# Patient Record
Sex: Male | Born: 1960 | Race: White | Hispanic: No | Marital: Married | Smoking: Former smoker
Health system: Southern US, Community
[De-identification: ages and names within clinical notes are randomized; demographics above are authoritative.]

## PROBLEM LIST (undated history)

## (undated) DIAGNOSIS — R519 Headache, unspecified: Secondary | ICD-10-CM

## (undated) DIAGNOSIS — U071 COVID-19: Secondary | ICD-10-CM

## (undated) DIAGNOSIS — I2699 Other pulmonary embolism without acute cor pulmonale: Secondary | ICD-10-CM

## (undated) DIAGNOSIS — R5382 Chronic fatigue, unspecified: Secondary | ICD-10-CM

## (undated) DIAGNOSIS — K219 Gastro-esophageal reflux disease without esophagitis: Secondary | ICD-10-CM

## (undated) DIAGNOSIS — R413 Other amnesia: Secondary | ICD-10-CM

## (undated) HISTORY — DX: Headache, unspecified: R51.9

## (undated) HISTORY — DX: Other amnesia: R41.3

## (undated) HISTORY — DX: Other pulmonary embolism without acute cor pulmonale: I26.99

## (undated) HISTORY — DX: Chronic fatigue, unspecified: R53.82

## (undated) HISTORY — PX: HERNIA REPAIR: SHX51

---

## 1998-04-05 ENCOUNTER — Emergency Department (HOSPITAL_COMMUNITY): Admission: EM | Admit: 1998-04-05 | Discharge: 1998-04-06 | Payer: Self-pay | Admitting: Emergency Medicine

## 2001-06-08 ENCOUNTER — Encounter: Payer: Self-pay | Admitting: Internal Medicine

## 2001-06-08 ENCOUNTER — Encounter: Admission: RE | Admit: 2001-06-08 | Discharge: 2001-06-08 | Payer: Self-pay | Admitting: Internal Medicine

## 2001-06-14 ENCOUNTER — Encounter: Payer: Self-pay | Admitting: Internal Medicine

## 2001-06-14 ENCOUNTER — Encounter: Admission: RE | Admit: 2001-06-14 | Discharge: 2001-06-14 | Payer: Self-pay | Admitting: Internal Medicine

## 2005-01-26 ENCOUNTER — Ambulatory Visit: Payer: Self-pay | Admitting: Gastroenterology

## 2005-01-26 ENCOUNTER — Ambulatory Visit (HOSPITAL_COMMUNITY): Admission: RE | Admit: 2005-01-26 | Discharge: 2005-01-26 | Payer: Self-pay | Admitting: Gastroenterology

## 2005-02-02 ENCOUNTER — Ambulatory Visit: Payer: Self-pay | Admitting: Gastroenterology

## 2012-12-16 ENCOUNTER — Encounter: Payer: Self-pay | Admitting: Gastroenterology

## 2014-12-25 ENCOUNTER — Encounter: Payer: Self-pay | Admitting: Gastroenterology

## 2020-05-20 DIAGNOSIS — Z8701 Personal history of pneumonia (recurrent): Secondary | ICD-10-CM

## 2020-05-20 DIAGNOSIS — U099 Post covid-19 condition, unspecified: Secondary | ICD-10-CM

## 2020-05-20 DIAGNOSIS — R5381 Other malaise: Secondary | ICD-10-CM

## 2020-05-20 DIAGNOSIS — J9621 Acute and chronic respiratory failure with hypoxia: Secondary | ICD-10-CM | POA: Diagnosis not present

## 2020-05-21 DIAGNOSIS — J9621 Acute and chronic respiratory failure with hypoxia: Secondary | ICD-10-CM

## 2020-05-21 DIAGNOSIS — Z8701 Personal history of pneumonia (recurrent): Secondary | ICD-10-CM

## 2020-05-21 DIAGNOSIS — U099 Post covid-19 condition, unspecified: Secondary | ICD-10-CM | POA: Diagnosis not present

## 2020-05-21 DIAGNOSIS — R5381 Other malaise: Secondary | ICD-10-CM | POA: Diagnosis not present

## 2020-05-22 DIAGNOSIS — R5381 Other malaise: Secondary | ICD-10-CM

## 2020-05-22 DIAGNOSIS — J9621 Acute and chronic respiratory failure with hypoxia: Secondary | ICD-10-CM

## 2020-05-22 DIAGNOSIS — Z8701 Personal history of pneumonia (recurrent): Secondary | ICD-10-CM

## 2020-05-22 DIAGNOSIS — U099 Post covid-19 condition, unspecified: Secondary | ICD-10-CM

## 2020-05-23 DIAGNOSIS — R5381 Other malaise: Secondary | ICD-10-CM

## 2020-05-23 DIAGNOSIS — Z8701 Personal history of pneumonia (recurrent): Secondary | ICD-10-CM

## 2020-05-23 DIAGNOSIS — J9621 Acute and chronic respiratory failure with hypoxia: Secondary | ICD-10-CM

## 2020-05-23 DIAGNOSIS — U099 Post covid-19 condition, unspecified: Secondary | ICD-10-CM | POA: Diagnosis not present

## 2020-05-24 DIAGNOSIS — R5381 Other malaise: Secondary | ICD-10-CM

## 2020-05-24 DIAGNOSIS — J9621 Acute and chronic respiratory failure with hypoxia: Secondary | ICD-10-CM

## 2020-05-24 DIAGNOSIS — U099 Post covid-19 condition, unspecified: Secondary | ICD-10-CM

## 2020-05-24 DIAGNOSIS — Z8701 Personal history of pneumonia (recurrent): Secondary | ICD-10-CM

## 2020-05-25 DIAGNOSIS — U099 Post covid-19 condition, unspecified: Secondary | ICD-10-CM

## 2020-05-25 DIAGNOSIS — R5381 Other malaise: Secondary | ICD-10-CM | POA: Diagnosis not present

## 2020-05-25 DIAGNOSIS — Z8701 Personal history of pneumonia (recurrent): Secondary | ICD-10-CM

## 2020-05-25 DIAGNOSIS — J9621 Acute and chronic respiratory failure with hypoxia: Secondary | ICD-10-CM | POA: Diagnosis not present

## 2020-05-26 DIAGNOSIS — Z8701 Personal history of pneumonia (recurrent): Secondary | ICD-10-CM | POA: Diagnosis not present

## 2020-05-26 DIAGNOSIS — R5381 Other malaise: Secondary | ICD-10-CM

## 2020-05-26 DIAGNOSIS — U099 Post covid-19 condition, unspecified: Secondary | ICD-10-CM

## 2020-05-26 DIAGNOSIS — J9621 Acute and chronic respiratory failure with hypoxia: Secondary | ICD-10-CM

## 2020-06-11 ENCOUNTER — Ambulatory Visit: Payer: Managed Care, Other (non HMO) | Admitting: Internal Medicine

## 2020-06-11 ENCOUNTER — Encounter: Payer: Self-pay | Admitting: Internal Medicine

## 2020-06-11 VITALS — BP 107/77 | HR 101 | Temp 97.6°F | Resp 16 | Ht 76.0 in | Wt 229.6 lb

## 2020-06-11 DIAGNOSIS — R0602 Shortness of breath: Secondary | ICD-10-CM

## 2020-06-11 DIAGNOSIS — U099 Post covid-19 condition, unspecified: Secondary | ICD-10-CM

## 2020-06-11 DIAGNOSIS — J8410 Pulmonary fibrosis, unspecified: Secondary | ICD-10-CM

## 2020-06-11 NOTE — Patient Instructions (Signed)
Shortness of Breath, Adult Shortness of breath means you have trouble breathing. Shortness of breath could be a sign of a medical problem. Follow these instructions at home:  Watch for any changes in your symptoms.  Do not use any products that contain nicotine or tobacco, such as cigarettes, e-cigarettes, and chewing tobacco.  Do not smoke. Smoking can cause shortness of breath. If you need help to quit smoking, ask your doctor.  Avoid things that can make it harder to breathe, such as: ? Mold. ? Dust. ? Air pollution. ? Chemical smells. ? Things that can cause allergy symptoms (allergens), if you have allergies.  Keep your living space clean. Use products that help remove mold and dust.  Rest as needed. Slowly return to your normal activities.  Take over-the-counter and prescription medicines only as told by your doctor. This includes oxygen therapy and inhaled medicines.  Keep all follow-up visits as told by your doctor. This is important.   Contact a doctor if:  Your condition does not get better as soon as expected.  You have a hard time doing your normal activities, even after you rest.  You have new symptoms. Get help right away if:  Your shortness of breath gets worse.  You have trouble breathing when you are resting.  You feel light-headed or you pass out (faint).  You have a cough that is not helped by medicines.  You cough up blood.  You have pain with breathing.  You have pain in your chest, arms, shoulders, or belly (abdomen).  You have a fever.  You cannot walk up stairs.  You cannot exercise the way you normally do. These symptoms may represent a serious problem that is an emergency. Do not wait to see if the symptoms will go away. Get medical help right away. Call your local emergency services (911 in the [place] Do not drive yourself to the hospital. Summary  Shortness of breath is when you have trouble breathing enough air. It can be a sign of a  medical problem.  Avoid things that make it hard for you to breathe, such as smoking, pollution, mold, and dust.  Watch for any changes in your symptoms. Contact your doctor if you do not get better or you get worse. This information is not intended to replace advice given to you by your health care provider. Make sure you discuss any questions you have with your health care provider. Document Revised: 09/20/2017 Document Reviewed: 09/20/2017 Elsevier Patient Education  2021 [employer]  

## 2020-06-11 NOTE — Progress Notes (Signed)
[facility] [address] [place], [place] [address]  Pulmonary Sleep Medicine   Office Visit Note  Patient Name: Jack Barton DOB: 1960/11/26 MRN [id number]  Date of Service: 06/11/2020  Complaints/HPI: Post Covid. Still with some SOB. Also has sinus issues. He feels better but not back to baseline. Patient states that he uses incentive spirometery. He is a former smoker stopped age 60. Has history of pneumonia in the past.  The patient had a prolonged stay at [facility] he states that he is doing somewhat better now still with shortness of breath on exertion.  Still has occasional cough.  He wanted to be evaluated for Post COVID syndrome  ROS  General: (-) fever, (-) chills, (-) night sweats, (-) weakness Skin: (-) rashes, (-) itching,. Eyes: (-) visual changes, (-) redness, (-) itching. Nose and Sinuses: (-) nasal stuffiness or itchiness, (-) postnasal drip, (-) nosebleeds, (-) sinus trouble. Mouth and Throat: (-) sore throat, (-) hoarseness. Neck: (-) swollen glands, (-) enlarged thyroid, (-) neck pain. Respiratory: + cough, (-) bloody sputum, + coarse shortness of breath, - wheezing. Cardiovascular: - ankle swelling, (-) chest pain. Lymphatic: (-) lymph node enlargement. Neurologic: (-) numbness, (-) tingling. Psychiatric: (-) anxiety, (-) depression   Current Medication: Outpatient Encounter Medications as of 06/11/2020  Medication Sig  . cefdinir (OMNICEF) 300 MG capsule Take 300 mg by mouth 2 (two) times daily.  . pantoprazole (PROTONIX) 40 MG tablet Take 40 mg by mouth daily.   No facility-administered encounter medications on file as of 06/11/2020.    Surgical History: History reviewed. No pertinent surgical history.  Medical History: History reviewed. No pertinent past medical history.  Family History: History reviewed. No pertinent family history.  Social History: Social History   Socioeconomic History  . Marital status: Married     Spouse name: Not on file  . Number of children: Not on file  . Years of education: Not on file  . Highest education level: Not on file  Occupational History  . Not on file  Tobacco Use  . Smoking status: Never Smoker  . Smokeless tobacco: Never Used  Substance and Sexual Activity  . Alcohol use: Never  . Drug use: Never  . Sexual activity: Not on file  Other Topics Concern  . Not on file  Social History Narrative  . Not on file   Social Determinants of Health   Financial Resource Strain: Not on file  Food Insecurity: Not on file  Transportation Needs: Not on file  Physical Activity: Not on file  Stress: Not on file  Social Connections: Not on file  Intimate Partner Violence: Not on file    Vital Signs: Blood pressure 107/77, pulse (!) 101, temperature 97.6 F (36.4 C), resp. rate 16, height 6\' 4"  (1.93 m), weight 229 lb 9.6 oz (104.1 kg), SpO2 97 %.  Examination: General Appearance: The patient is well-developed, well-nourished, and in no distress. Skin: Gross inspection of skin unremarkable. Head: normocephalic, no gross deformities. Eyes: no gross deformities noted. ENT: ears appear grossly normal no exudates. Neck: Supple. No thyromegaly. No LAD. Respiratory: -. Cardiovascular: Normal S1 and S2 without murmur or rub. Extremities: No cyanosis. pulses are equal. Neurologic: Alert and oriented. No involuntary movements.  LABS: No results found for this or any previous visit (from the past 2160 hour(s)).  Radiology: No results found.  No results found.  No results found.    Assessment and Plan: Patient Active Problem List   Diagnosis Date Noted  .  Post covid-19 condition, unspecified 06/19/2020  . SOB (shortness of breath) 06/19/2020  . Pulmonary fibrosis (HCC) 06/19/2020  . Long COVID   . Multifocal pneumonia 06/14/2020  . Sepsis (HCC) 06/14/2020  . Acute hypoxemic respiratory failure (HCC) 06/14/2020  . Acute pulmonary embolism-submassive 06/14/2020   . DVT (deep venous thrombosis) (HCC) 06/14/2020  . GERD (gastroesophageal reflux disease)    1. Post covid-19 condition, unspecified He is slowly recovering however my concern is that he may have post COVID disease resulting in fibrotic lung disease.  We will get a plain chest x-ray to assess.  After the x-ray is done I think he will need a high resolution CT scan for a look at the parenchyma to see if there is any cystic lung disease noted.  This may help [place] prognostically to better understand his disease process  2. SOB (shortness of breath) Very slow to improve continues to be oxygen dependent.  Pulmonary functions - Pulmonary function test; Future - ECHOCARDIOGRAM COMPLETE; Future - 6 minute walk; Future  3. Pulmonary fibrosis (HCC) As above will get CT scan of the chest because of abnormal x-rays that were noted on the admission to [facility] in [place]. - Pulmonary function test; Future - CT Chest High Resolution; Future - 6 minute walk; Future   General Counseling: I have discussed the findings of the evaluation and examination with Jack Barton.  I have also discussed any further diagnostic evaluation thatmay be needed or ordered today. Jack Barton verbalizes understanding of the findings of todays visit. We also reviewed his medications today and discussed drug interactions and side effects including but not limited excessive drowsiness and altered mental states. We also discussed that there is always a risk not just to him but also people around him. he has been encouraged to call the office with any questions or concerns that should arise related to todays visit.  No orders of the defined types were placed in this encounter.    Time spent: 49  I have personally obtained a history, examined the patient, evaluated laboratory and imaging results, formulated the assessment and plan and placed orders.    Yevonne Pax, MD [facility] Pulmonary and Critical Care Sleep medicine

## 2020-06-14 ENCOUNTER — Emergency Department: Payer: Managed Care, Other (non HMO)

## 2020-06-14 ENCOUNTER — Inpatient Hospital Stay: Payer: Managed Care, Other (non HMO)

## 2020-06-14 ENCOUNTER — Inpatient Hospital Stay
Admit: 2020-06-14 | Discharge: 2020-06-14 | Disposition: A | Discharge status: Home / Self Care | Payer: Managed Care, Other (non HMO) | Attending: Internal Medicine | Admitting: Internal Medicine

## 2020-06-14 ENCOUNTER — Encounter: Payer: Self-pay | Admitting: Emergency Medicine

## 2020-06-14 ENCOUNTER — Inpatient Hospital Stay
Admission: EM | Admit: 2020-06-14 | Discharge: 2020-06-16 | DRG: 853 | Disposition: A | Discharge status: Home / Self Care | Payer: Managed Care, Other (non HMO) | Attending: Internal Medicine | Admitting: Internal Medicine

## 2020-06-14 ENCOUNTER — Other Ambulatory Visit (INDEPENDENT_AMBULATORY_CARE_PROVIDER_SITE_OTHER): Payer: Self-pay | Admitting: Vascular Surgery

## 2020-06-14 ENCOUNTER — Encounter
Admission: EM | Disposition: A | Discharge status: Home / Self Care | Payer: Self-pay | Source: Home / Self Care | Attending: Internal Medicine

## 2020-06-14 ENCOUNTER — Other Ambulatory Visit: Payer: Self-pay

## 2020-06-14 DIAGNOSIS — I5189 Other ill-defined heart diseases: Secondary | ICD-10-CM | POA: Diagnosis present

## 2020-06-14 DIAGNOSIS — I2609 Other pulmonary embolism with acute cor pulmonale: Secondary | ICD-10-CM | POA: Diagnosis not present

## 2020-06-14 DIAGNOSIS — J188 Other pneumonia, unspecified organism: Secondary | ICD-10-CM | POA: Diagnosis present

## 2020-06-14 DIAGNOSIS — K219 Gastro-esophageal reflux disease without esophagitis: Secondary | ICD-10-CM | POA: Diagnosis present

## 2020-06-14 DIAGNOSIS — Z20822 Contact with and (suspected) exposure to covid-19: Secondary | ICD-10-CM | POA: Diagnosis present

## 2020-06-14 DIAGNOSIS — Z8041 Family history of malignant neoplasm of ovary: Secondary | ICD-10-CM

## 2020-06-14 DIAGNOSIS — I2584 Coronary atherosclerosis due to calcified coronary lesion: Secondary | ICD-10-CM | POA: Diagnosis present

## 2020-06-14 DIAGNOSIS — U099 Post covid-19 condition, unspecified: Secondary | ICD-10-CM

## 2020-06-14 DIAGNOSIS — A419 Sepsis, unspecified organism: Secondary | ICD-10-CM | POA: Diagnosis present

## 2020-06-14 DIAGNOSIS — Y95 Nosocomial condition: Secondary | ICD-10-CM | POA: Diagnosis present

## 2020-06-14 DIAGNOSIS — K4090 Unilateral inguinal hernia, without obstruction or gangrene, not specified as recurrent: Secondary | ICD-10-CM | POA: Diagnosis present

## 2020-06-14 DIAGNOSIS — I82403 Acute embolism and thrombosis of unspecified deep veins of lower extremity, bilateral: Secondary | ICD-10-CM | POA: Diagnosis present

## 2020-06-14 DIAGNOSIS — R0602 Shortness of breath: Secondary | ICD-10-CM

## 2020-06-14 DIAGNOSIS — K760 Fatty (change of) liver, not elsewhere classified: Secondary | ICD-10-CM | POA: Diagnosis present

## 2020-06-14 DIAGNOSIS — J9601 Acute respiratory failure with hypoxia: Secondary | ICD-10-CM | POA: Diagnosis present

## 2020-06-14 DIAGNOSIS — J9691 Respiratory failure, unspecified with hypoxia: Secondary | ICD-10-CM

## 2020-06-14 DIAGNOSIS — I82409 Acute embolism and thrombosis of unspecified deep veins of unspecified lower extremity: Secondary | ICD-10-CM | POA: Diagnosis present

## 2020-06-14 DIAGNOSIS — I2699 Other pulmonary embolism without acute cor pulmonale: Secondary | ICD-10-CM | POA: Diagnosis present

## 2020-06-14 DIAGNOSIS — I82453 Acute embolism and thrombosis of peroneal vein, bilateral: Secondary | ICD-10-CM | POA: Diagnosis not present

## 2020-06-14 DIAGNOSIS — J189 Pneumonia, unspecified organism: Secondary | ICD-10-CM | POA: Diagnosis present

## 2020-06-14 DIAGNOSIS — R109 Unspecified abdominal pain: Secondary | ICD-10-CM | POA: Diagnosis present

## 2020-06-14 HISTORY — DX: COVID-19: U07.1

## 2020-06-14 HISTORY — PX: PULMONARY THROMBECTOMY: CATH118295

## 2020-06-14 HISTORY — DX: Gastro-esophageal reflux disease without esophagitis: K21.9

## 2020-06-14 LAB — CBC WITH DIFFERENTIAL/PLATELET
Abs Immature Granulocytes: 0.06 10*3/uL (ref 0.00–0.07)
Basophils Absolute: 0.1 10*3/uL (ref 0.0–0.1)
Basophils Relative: 1 %
Eosinophils Absolute: 0.1 10*3/uL (ref 0.0–0.5)
Eosinophils Relative: 1 %
HCT: 39.9 % (ref 39.0–52.0)
Hemoglobin: 13.8 g/dL (ref 13.0–17.0)
Immature Granulocytes: 1 %
Lymphocytes Relative: 14 %
Lymphs Abs: 1.6 10*3/uL (ref 0.7–4.0)
MCH: 30.7 pg (ref 26.0–34.0)
MCHC: 34.6 g/dL (ref 30.0–36.0)
MCV: 88.9 fL (ref 80.0–100.0)
Monocytes Absolute: 0.9 10*3/uL (ref 0.1–1.0)
Monocytes Relative: 8 %
Neutro Abs: 8.5 10*3/uL — ABNORMAL HIGH (ref 1.7–7.7)
Neutrophils Relative %: 75 %
Platelets: 255 10*3/uL (ref 150–400)
RBC: 4.49 MIL/uL (ref 4.22–5.81)
RDW: 12.9 % (ref 11.5–15.5)
WBC: 11.3 10*3/uL — ABNORMAL HIGH (ref 4.0–10.5)
nRBC: 0 % (ref 0.0–0.2)

## 2020-06-14 LAB — URINALYSIS, COMPLETE (UACMP) WITH MICROSCOPIC
Bacteria, UA: NONE SEEN
Bilirubin Urine: NEGATIVE
Glucose, UA: NEGATIVE mg/dL
Hgb urine dipstick: NEGATIVE
Ketones, ur: NEGATIVE mg/dL
Leukocytes,Ua: NEGATIVE
Nitrite: NEGATIVE
Protein, ur: NEGATIVE mg/dL
Specific Gravity, Urine: 1.02 (ref 1.005–1.030)
pH: 5 (ref 5.0–8.0)

## 2020-06-14 LAB — RESP PANEL BY RT-PCR (FLU A&B, COVID) ARPGX2
Influenza A by PCR: NEGATIVE
Influenza B by PCR: NEGATIVE
SARS Coronavirus 2 by RT PCR: NEGATIVE

## 2020-06-14 LAB — COMPREHENSIVE METABOLIC PANEL
ALT: 17 U/L (ref 0–44)
AST: 20 U/L (ref 15–41)
Albumin: 3.4 g/dL — ABNORMAL LOW (ref 3.5–5.0)
Alkaline Phosphatase: 91 U/L (ref 38–126)
Anion gap: 9 (ref 5–15)
BUN: 13 mg/dL (ref 6–20)
CO2: 23 mmol/L (ref 22–32)
Calcium: 8.7 mg/dL — ABNORMAL LOW (ref 8.9–10.3)
Chloride: 101 mmol/L (ref 98–111)
Creatinine, Ser: 0.72 mg/dL (ref 0.61–1.24)
GFR, Estimated: >60 mL/min (ref >60)
Glucose, Bld: 120 mg/dL — ABNORMAL HIGH (ref 70–99)
Potassium: 4 mmol/L (ref 3.5–5.1)
Sodium: 133 mmol/L — ABNORMAL LOW (ref 135–145)
Total Bilirubin: 1.2 mg/dL (ref 0.3–1.2)
Total Protein: 7.5 g/dL (ref 6.5–8.1)

## 2020-06-14 LAB — FIBRIN DERIVATIVES D-DIMER (ARMC ONLY): Fibrin derivatives D-dimer (ARMC): 5565.99 ng/mL (FEU) — ABNORMAL HIGH (ref 0.00–499.00)

## 2020-06-14 LAB — PROCALCITONIN: Procalcitonin: <0.1 ng/mL

## 2020-06-14 LAB — LIPASE, BLOOD: Lipase: 24 U/L (ref 11–51)

## 2020-06-14 LAB — STREP PNEUMONIAE URINARY ANTIGEN: Strep Pneumo Urinary Antigen: NEGATIVE

## 2020-06-14 LAB — PROTIME-INR
INR: 1.1 (ref 0.8–1.2)
Prothrombin Time: 13.7 seconds (ref 11.4–15.2)

## 2020-06-14 LAB — SEDIMENTATION RATE: Sed Rate: 72 mm/hr — ABNORMAL HIGH (ref 0–20)

## 2020-06-14 LAB — HIV ANTIBODY (ROUTINE TESTING W REFLEX): HIV Screen 4th Generation wRfx: NONREACTIVE

## 2020-06-14 LAB — TROPONIN I (HIGH SENSITIVITY)
Troponin I (High Sensitivity): 4 ng/L (ref <18)
Troponin I (High Sensitivity): 15 ng/L (ref <18)

## 2020-06-14 LAB — BRAIN NATRIURETIC PEPTIDE: B Natriuretic Peptide: 16.1 pg/mL (ref 0.0–100.0)

## 2020-06-14 LAB — LACTIC ACID, PLASMA: Lactic Acid, Venous: 1.5 mmol/L (ref 0.5–1.9)

## 2020-06-14 LAB — APTT: aPTT: 36 seconds (ref 24–36)

## 2020-06-14 SURGERY — PULMONARY THROMBECTOMY
Anesthesia: Moderate Sedation | Laterality: Bilateral

## 2020-06-14 MED ORDER — VANCOMYCIN HCL IN DEXTROSE 1-5 GM/200ML-% IV SOLN
1000.0000 mg | Freq: Once | INTRAVENOUS | Status: AC
  Administered 2020-06-14: 1000 mg via INTRAVENOUS
  Filled 2020-06-14: qty 200

## 2020-06-14 MED ORDER — ALTEPLASE 2 MG IJ SOLR
INTRAMUSCULAR | Status: AC
  Filled 2020-06-14: qty 10

## 2020-06-14 MED ORDER — SODIUM CHLORIDE 0.9% FLUSH
3.0000 mL | INTRAVENOUS | Status: DC | PRN
  Administered 2020-06-14: 3 mL via INTRAVENOUS

## 2020-06-14 MED ORDER — ONDANSETRON HCL 4 MG/2ML IJ SOLN: 4.0000 mg | Freq: Three times a day (TID) | INTRAMUSCULAR | Status: DC | PRN

## 2020-06-14 MED ORDER — IODIXANOL 320 MG/ML IV SOLN
INTRAVENOUS | Status: DC | PRN
  Administered 2020-06-14: 30 mL

## 2020-06-14 MED ORDER — FENTANYL CITRATE (PF) 100 MCG/2ML IJ SOLN
INTRAMUSCULAR | Status: AC
  Administered 2020-06-14: 50 ug
  Filled 2020-06-14: qty 2

## 2020-06-14 MED ORDER — IPRATROPIUM-ALBUTEROL 0.5-2.5 (3) MG/3ML IN SOLN
3.0000 mL | RESPIRATORY_TRACT | Status: DC
  Administered 2020-06-14 – 2020-06-16 (×11): 3 mL via RESPIRATORY_TRACT
  Filled 2020-06-14 (×12): qty 3

## 2020-06-14 MED ORDER — METHYLPREDNISOLONE SODIUM SUCC 125 MG IJ SOLR: 125.0000 mg | Freq: Once | INTRAMUSCULAR | Status: DC | PRN

## 2020-06-14 MED ORDER — LACTATED RINGERS IV BOLUS (SEPSIS)
500.0000 mL | Freq: Once | INTRAVENOUS | Status: AC
  Administered 2020-06-14: 500 mL via INTRAVENOUS

## 2020-06-14 MED ORDER — VANCOMYCIN HCL 1250 MG/250ML IV SOLN
1250.0000 mg | Freq: Three times a day (TID) | INTRAVENOUS | Status: DC
  Administered 2020-06-14 – 2020-06-15 (×2): 1250 mg via INTRAVENOUS
  Filled 2020-06-14 (×5): qty 250

## 2020-06-14 MED ORDER — DM-GUAIFENESIN ER 30-600 MG PO TB12: 1.0000 | ORAL_TABLET | Freq: Two times a day (BID) | ORAL | Status: DC | PRN

## 2020-06-14 MED ORDER — HYDROMORPHONE HCL 1 MG/ML IJ SOLN
1.0000 mg | INTRAMUSCULAR | Status: AC
Start: 2020-06-14 — End: 2020-06-14
  Administered 2020-06-14: 1 mg via INTRAVENOUS
  Filled 2020-06-14: qty 1

## 2020-06-14 MED ORDER — MORPHINE SULFATE (PF) 2 MG/ML IV SOLN
2.0000 mg | INTRAVENOUS | Status: DC | PRN
  Administered 2020-06-14 – 2020-06-16 (×7): 2 mg via INTRAVENOUS
  Filled 2020-06-14 (×7): qty 1

## 2020-06-14 MED ORDER — ALPRAZOLAM 0.5 MG PO TABS
0.5000 mg | ORAL_TABLET | Freq: Two times a day (BID) | ORAL | Status: DC | PRN
  Administered 2020-06-14 – 2020-06-16 (×4): 0.5 mg via ORAL
  Filled 2020-06-14 (×4): qty 1

## 2020-06-14 MED ORDER — ONDANSETRON HCL 4 MG/2ML IJ SOLN: 4.0000 mg | Freq: Four times a day (QID) | INTRAMUSCULAR | Status: DC | PRN

## 2020-06-14 MED ORDER — HEPARIN (PORCINE) 25000 UT/250ML-% IV SOLN
1800.0000 [IU]/h | INTRAVENOUS | Status: DC
  Administered 2020-06-14 (×2): 1800 [IU]/h via INTRAVENOUS
  Filled 2020-06-14: qty 250

## 2020-06-14 MED ORDER — HEPARIN SODIUM (PORCINE) 1000 UNIT/ML IJ SOLN
INTRAMUSCULAR | Status: AC
  Filled 2020-06-14: qty 1

## 2020-06-14 MED ORDER — SODIUM CHLORIDE 0.9 % IV SOLN: INTRAVENOUS | Status: DC

## 2020-06-14 MED ORDER — HEPARIN SODIUM (PORCINE) 1000 UNIT/ML IJ SOLN
INTRAMUSCULAR | Status: DC | PRN
  Administered 2020-06-14: 2000 [IU] via INTRAVENOUS

## 2020-06-14 MED ORDER — ONDANSETRON HCL 4 MG/2ML IJ SOLN
4.0000 mg | INTRAMUSCULAR | Status: AC
  Administered 2020-06-14: 4 mg via INTRAVENOUS
  Filled 2020-06-14: qty 2

## 2020-06-14 MED ORDER — HYDROMORPHONE HCL 1 MG/ML IJ SOLN
0.5000 mg | INTRAMUSCULAR | Status: AC
  Administered 2020-06-14: 0.5 mg via INTRAVENOUS
  Filled 2020-06-14: qty 1

## 2020-06-14 MED ORDER — SODIUM CHLORIDE 0.9 % IV SOLN
2.0000 g | Freq: Once | INTRAVENOUS | Status: AC
  Administered 2020-06-14: 2 g via INTRAVENOUS
  Filled 2020-06-14: qty 2

## 2020-06-14 MED ORDER — SODIUM CHLORIDE 0.9 % IV BOLUS
500.0000 mL | Freq: Once | INTRAVENOUS | Status: AC
  Administered 2020-06-14: 500 mL via INTRAVENOUS

## 2020-06-14 MED ORDER — DIPHENHYDRAMINE HCL 50 MG/ML IJ SOLN: 50.0000 mg | Freq: Once | INTRAMUSCULAR | Status: DC | PRN

## 2020-06-14 MED ORDER — LACTATED RINGERS IV SOLN: INTRAVENOUS | Status: DC

## 2020-06-14 MED ORDER — MIDAZOLAM HCL 5 MG/5ML IJ SOLN
INTRAMUSCULAR | Status: AC
  Administered 2020-06-14: 2 mg
  Filled 2020-06-14: qty 5

## 2020-06-14 MED ORDER — MORPHINE SULFATE (PF) 2 MG/ML IV SOLN
2.0000 mg | Freq: Once | INTRAVENOUS | Status: DC
  Filled 2020-06-14: qty 1

## 2020-06-14 MED ORDER — KETOROLAC TROMETHAMINE 30 MG/ML IJ SOLN: 30.0000 mg | Freq: Four times a day (QID) | INTRAMUSCULAR | Status: DC | PRN

## 2020-06-14 MED ORDER — FUROSEMIDE 10 MG/ML IJ SOLN
40.0000 mg | Freq: Once | INTRAMUSCULAR | Status: AC
  Administered 2020-06-14: 40 mg via INTRAVENOUS
  Filled 2020-06-14: qty 4

## 2020-06-14 MED ORDER — MIDAZOLAM HCL 2 MG/ML PO SYRP: 8.0000 mg | ORAL_SOLUTION | Freq: Once | ORAL | Status: DC | PRN

## 2020-06-14 MED ORDER — CLINDAMYCIN PHOSPHATE 300 MG/50ML IV SOLN
INTRAVENOUS | Status: AC
  Administered 2020-06-14: 300 mg
  Filled 2020-06-14: qty 50

## 2020-06-14 MED ORDER — PANTOPRAZOLE SODIUM 40 MG PO TBEC
40.0000 mg | DELAYED_RELEASE_TABLET | Freq: Every day | ORAL | Status: DC
  Administered 2020-06-15 – 2020-06-16 (×2): 40 mg via ORAL
  Filled 2020-06-14 (×2): qty 1

## 2020-06-14 MED ORDER — OXYCODONE-ACETAMINOPHEN 5-325 MG PO TABS
1.0000 | ORAL_TABLET | ORAL | Status: DC | PRN
  Administered 2020-06-15: 1 via ORAL
  Filled 2020-06-14: qty 1

## 2020-06-14 MED ORDER — FAMOTIDINE 20 MG PO TABS: 40.0000 mg | ORAL_TABLET | Freq: Once | ORAL | Status: DC | PRN

## 2020-06-14 MED ORDER — CLINDAMYCIN PHOSPHATE 300 MG/50ML IV SOLN
300.0000 mg | Freq: Once | INTRAVENOUS | Status: DC
  Filled 2020-06-14: qty 50

## 2020-06-14 MED ORDER — IOHEXOL 350 MG/ML SOLN
100.0000 mL | Freq: Once | INTRAVENOUS | Status: AC | PRN
  Administered 2020-06-14: 100 mL via INTRAVENOUS

## 2020-06-14 MED ORDER — MORPHINE SULFATE (PF) 2 MG/ML IV SOLN
2.0000 mg | Freq: Once | INTRAVENOUS | Status: AC
Start: 2020-06-14 — End: 2020-06-14
  Administered 2020-06-14: 2 mg via INTRAVENOUS
  Filled 2020-06-14: qty 1

## 2020-06-14 MED ORDER — ALBUTEROL SULFATE (2.5 MG/3ML) 0.083% IN NEBU
INHALATION_SOLUTION | RESPIRATORY_TRACT | Status: AC
  Administered 2020-06-14: 2.5 mg
  Filled 2020-06-14: qty 3

## 2020-06-14 MED ORDER — ALBUTEROL SULFATE (2.5 MG/3ML) 0.083% IN NEBU: 2.5000 mg | INHALATION_SOLUTION | RESPIRATORY_TRACT | Status: DC | PRN

## 2020-06-14 MED ORDER — SODIUM CHLORIDE 0.9 % IV SOLN
2.0000 g | Freq: Three times a day (TID) | INTRAVENOUS | Status: DC
  Administered 2020-06-14 – 2020-06-16 (×6): 2 g via INTRAVENOUS
  Filled 2020-06-14 (×9): qty 2

## 2020-06-14 MED ORDER — HEPARIN BOLUS VIA INFUSION
7000.0000 [IU] | Freq: Once | INTRAVENOUS | Status: AC
  Administered 2020-06-14: 7000 [IU] via INTRAVENOUS
  Filled 2020-06-14: qty 7000

## 2020-06-14 MED ORDER — ACETAMINOPHEN 325 MG PO TABS
650.0000 mg | ORAL_TABLET | Freq: Four times a day (QID) | ORAL | Status: DC | PRN
  Administered 2020-06-16: 650 mg via ORAL
  Filled 2020-06-14: qty 2

## 2020-06-14 MED ORDER — HEPARIN (PORCINE) 25000 UT/250ML-% IV SOLN
1800.0000 [IU]/h | INTRAVENOUS | Status: DC
  Administered 2020-06-14: 1800 [IU]/h via INTRAVENOUS
  Filled 2020-06-14: qty 250

## 2020-06-14 MED ORDER — HYDROMORPHONE HCL 1 MG/ML IJ SOLN: 1.0000 mg | Freq: Once | INTRAMUSCULAR | Status: DC | PRN

## 2020-06-14 SURGICAL SUPPLY — 14 items
CANISTER PENUMBRA ENGINE (MISCELLANEOUS) ×2 IMPLANT
CATH ANGIO 5F PIGTAIL 100CM (CATHETERS) ×2 IMPLANT
CATH INFINITI JR4 5F (CATHETERS) ×2 IMPLANT
CATH LIGHTNING 8 XTORQ 115 (CATHETERS) ×2 IMPLANT
DEVICE SAFEGUARD 24CM (GAUZE/BANDAGES/DRESSINGS) ×2 IMPLANT
GLIDEWIRE ADV .014X300CM (WIRE) ×2 IMPLANT
GLIDEWIRE ADV .035X260CM (WIRE) ×2 IMPLANT
NEEDLE ENTRY 21GA 7CM ECHOTIP (NEEDLE) ×2 IMPLANT
PACK ANGIOGRAPHY (CUSTOM PROCEDURE TRAY) ×2 IMPLANT
SET INTRO CAPELLA COAXIAL (SET/KITS/TRAYS/PACK) ×2 IMPLANT
SHEATH BRITE TIP 8FRX11 (SHEATH) ×2 IMPLANT
TUBING CONTRAST HIGH PRESS 72 (TUBING) ×2 IMPLANT
VALVE HEMO TOUHY BORST Y (ADAPTER) ×2 IMPLANT
WIRE GUIDERIGHT .035X150 (WIRE) ×2 IMPLANT

## 2020-06-14 NOTE — ED Notes (Signed)
ED at bedside.  

## 2020-06-14 NOTE — Consult Note (Signed)
[facility] Vascular Consult Note  MRN : [id number]  Jack Barton is a 60 y.o. ([birth year]) male who presents with chief complaint of  Chief Complaint  Patient presents with  . Shortness of Breath   History of Present Illness:  The patient is a 60 year old male a with medical history significant of GERD, recent COVID-19 infection, who presents with shortness breath, left flank pain.  Patient was recently hospitalized due to COVID-19 infection and discharged on 1/27 from [facility].  Patient states that he developed left flank pain in left back and left rib cage since last night.  The pain is pleuritic, sharp, severe, nonradiating, aggravated by deep breath.  He has a dry cough, no fever or chills.  His shortness breath has worsened. Pt states that this morning his O2 sat was low and his heart rate was fast.  No nausea vomiting, diarrhea or abdominal pain.  No symptoms of UTI. Upon arrival to ED, patient was noted to have acute respiratory distress with hyperventilation. Patient was found to have oxygen desaturated to 85% on room air, which improved to 95% on 4 L oxygen.  CTA chest (06/14/20): 1. Pulmonary emboli arise from each distal main pulmonary artery with extension into multiple lower lobe pulmonary artery branches bilaterally. Pulmonary embolus also seen in the proximal aspect of the right upper lobe pulmonary artery. Positive for acute PE with CTevidence of right heart strain (RV/LV Ratio = 1.2) consistent with at least submassive (intermediate risk) PE. The presence of right heart strain has been associated with an increased risk of morbidity and mortality.  Vascular surgery was consulted by Dr. Clyde Lundborg for possible thrombectomy / thrombolysis.  Current Facility-Administered Medications  Medication Dose Route Frequency Provider Last Rate Last Admin  . 0.9 %  sodium chloride infusion   Intravenous Continuous Azaryah Oleksy A, PA-C      . [MAR Hold]  acetaminophen (TYLENOL) tablet 650 mg  650 mg Oral Q6H PRN Lorretta Harp, MD      . Mitzi Hansen Hold] albuterol (PROVENTIL) (2.5 MG/3ML) 0.083% nebulizer solution 2.5 mg  2.5 mg Nebulization Q4H PRN Lorretta Harp, MD      . Mitzi Hansen Hold] ceFEPIme (MAXIPIME) 2 g in sodium chloride 0.9 % 100 mL IVPB  2 g Intravenous Q8H Hallaji, Sheema M, RPH      . [START ON 06/15/2020] clindamycin (CLEOCIN) IVPB 300 mg  300 mg Intravenous Once Theordore Cisnero A, PA-C      . [MAR Hold] dextromethorphan-guaiFENesin (MUCINEX DM) 30-600 MG per 12 hr tablet 1 tablet  1 tablet Oral BID PRN Lorretta Harp, MD      . diphenhydrAMINE (BENADRYL) injection 50 mg  50 mg Intravenous Once PRN Bellatrix Devonshire A, PA-C      . famotidine (PEPCID) tablet 40 mg  40 mg Oral Once PRN Valmai Vandenberghe A, PA-C      . heparin ADULT infusion 100 units/mL (25000 units/249mL)  1,800 Units/hr Intravenous Continuous Lorretta Harp, MD   Stopped at 06/14/20 1545  . heparin sodium (porcine) 1000 UNIT/ML injection           . heparin sodium (porcine) injection    PRN Schnier, Latina Craver, MD   2,000 Units at 06/14/20 1625  . [MAR Hold] HYDROmorphone (DILAUDID) injection 1 mg  1 mg Intravenous Once PRN Nubia Ziesmer, Ranae Plumber, PA-C      . [MAR Hold] ipratropium-albuterol (DUONEB) 0.5-2.5 (3) MG/3ML nebulizer solution 3 mL  3 mL Nebulization Q4H Lorretta Harp, MD  3 mL at 06/14/20 1255  . lactated ringers infusion   Intravenous Continuous Lorretta Harp, MD 100 mL/hr at 06/14/20 1245 Rate Change at 06/14/20 1245  . methylPREDNISolone sodium succinate (SOLU-MEDROL) 125 mg/2 mL injection 125 mg  125 mg Intravenous Once PRN Liam Bossman A, PA-C      . midazolam (VERSED) 2 MG/ML syrup 8 mg  8 mg Oral Once PRN Leocadia Idleman, Ranae Plumber, PA-C      . [MAR Hold] morphine 2 MG/ML injection 2 mg  2 mg Intravenous Q4H PRN Lorretta Harp, MD   2 mg at 06/14/20 1248  . [MAR Hold] ondansetron (ZOFRAN) injection 4 mg  4 mg Intravenous Q8H PRN Lorretta Harp, MD      . Mitzi Hansen Hold] ondansetron  [facility]) injection 4 mg  4 mg Intravenous Q6H PRN [place] Silveria, Ranae Plumber, PA-C      . [MAR Hold] oxyCODONE-acetaminophen (PERCOCET/ROXICET) 5-325 MG per tablet 1 tablet  1 tablet Oral Q4H PRN Lorretta Harp, MD      . Mitzi Hansen Hold] pantoprazole (PROTONIX) EC tablet 40 mg  40 mg Oral Daily Lorretta Harp, MD      . Mitzi Hansen Hold] vancomycin (VANCOREADY) IVPB 1250 mg/250 mL  1,250 mg Intravenous Q8H Hallaji, Mardene Speak, [facility]       Past Medical History:  Diagnosis Date  . COVID-19 virus infection   . GERD (gastroesophageal reflux disease)    Past Surgical History:  Procedure Laterality Date  . HERNIA REPAIR     Social History Social History   Tobacco Use  . Smoking status: Never Smoker  . Smokeless tobacco: Never Used  Substance Use Topics  . Alcohol use: Never  . Drug use: Never   Family History Family History  Problem Relation Age of Onset  . Ovarian cancer Mother   Denies family history of peripheral artery disease, venous disease or bleeding/clotting disorders.  No Known Allergies  REVIEW OF SYSTEMS (Negative unless checked)  Constitutional: [] Weight loss  [] Fever  [] Chills Cardiac: [] Chest pain   [] Chest pressure   [] Palpitations   [] Shortness of breath when laying flat   [] Shortness of breath at rest   [] Shortness of breath with exertion. Vascular:  [] Pain in legs with walking   [] Pain in legs at rest   [] Pain in legs when laying flat   [] Claudication   [] Pain in feet when walking  [] Pain in feet at rest  [] Pain in feet when laying flat   [] History of DVT   [] Phlebitis   [] Swelling in legs   [] Varicose veins   [] Non-healing ulcers Pulmonary:   [] Uses home oxygen   [] Productive cough   [] Hemoptysis   [] Wheeze  [] COPD   [] Asthma Neurologic:  [] Dizziness  [] Blackouts   [] Seizures   [] History of stroke   [] History of TIA  [] Aphasia   [] Temporary blindness   [] Dysphagia   [] Weakness or numbness in arms   [] Weakness or numbness in legs Musculoskeletal:  [] Arthritis   [] Joint swelling   [] Joint pain    [] Low back pain Hematologic:  [] Easy bruising  [] Easy bleeding   [] Hypercoagulable state   [] Anemic  [] Hepatitis Gastrointestinal:  [] Blood in stool   [] Vomiting blood  [] Gastroesophageal reflux/heartburn   [] Difficulty swallowing. Genitourinary:  [] Chronic kidney disease   [] Difficult urination  [] Frequent urination  [] Burning with urination   [] Blood in urine Skin:  [] Rashes   [] Ulcers   [] Wounds Psychological:  [] History of anxiety   []  History of major depression.  Physical Examination  Vitals:   06/14/20 1430  06/14/20 1500 06/14/20 1538 06/14/20 1613  BP: 120/85 (!) 141/85 135/83   Pulse: (!) 116 (!) 117 (!) 125   Resp: (!) 33 (!) 30 (!) 38   Temp:      TempSrc:      SpO2: 93% 93% 93% 95%  Weight:   104.3 kg   Height:   6\' 4"  (1.93 m)    Body mass index is 27.99 kg/m. Gen:  WD/WN, labored breathing Head: [place]/AT, No temporalis wasting. Prominent temp pulse not noted. Ear/Nose/Throat: Hearing grossly intact, nares w/o erythema or drainage, oropharynx w/o Erythema/Exudate Eyes: Sclera non-icteric, conjunctiva clear Neck: Trachea midline.  No JVD.  Pulmonary: Labored breathing Cardiac: RRR, normal S1, S2. Vascular:  Vessel Right Left  Radial Palpable Palpable  Ulnar Palpable Palpable  Brachial Palpable Palpable  Carotid Palpable, without bruit Palpable, without bruit  Aorta Not palpable N/A  Femoral Palpable Palpable  Popliteal Palpable Palpable  PT Palpable Palpable  DP Palpable Palpable   Gastrointestinal: soft, non-tender/non-distended. No guarding/reflex.  Musculoskeletal: M/S 5/5 throughout.  Extremities without ischemic changes.  No deformity or atrophy. No edema. Neurologic: Sensation grossly intact in extremities.  Symmetrical.  Speech is fluent. Motor exam as listed above. Psychiatric: Judgment intact, Mood & affect appropriate for pt's clinical situation. Dermatologic: No rashes or ulcers noted.  No cellulitis or open wounds. Lymph : No Cervical, Axillary, or  Inguinal lymphadenopathy.  CBC Lab Results  Component Value Date   WBC 11.3 (H) 06/14/2020   HGB 13.8 06/14/2020   HCT 39.9 06/14/2020   MCV 88.9 06/14/2020   PLT 255 06/14/2020   BMET    Component Value Date/Time   NA 133 (L) 06/14/2020 0913   K 4.0 06/14/2020 0913   CL 101 06/14/2020 0913   CO2 23 06/14/2020 0913   GLUCOSE 120 (H) 06/14/2020 0913   BUN 13 06/14/2020 0913   CREATININE 0.72 06/14/2020 0913   CALCIUM 8.7 (L) 06/14/2020 0913   GFRNONAA >60 06/14/2020 0913   Estimated Creatinine Clearance: 131.9 mL/min (by C-G formula based on SCr of 0.72 mg/dL).  COAG Lab Results  Component Value Date   INR 1.1 06/14/2020   Radiology CT Angio Chest PE W and/or Wo Contrast  Result Date: 06/14/2020 CLINICAL DATA:  Chest and abdominal pain EXAM: CT ANGIOGRAPHY CHEST CT ABDOMEN AND PELVIS WITH CONTRAST TECHNIQUE: Multidetector CT imaging of the chest was performed using the standard protocol during bolus administration of intravenous contrast. Multiplanar CT image reconstructions and MIPs were obtained to evaluate the vascular anatomy. Multidetector CT imaging of the abdomen and pelvis was performed using the standard protocol during bolus administration of intravenous contrast. CONTRAST:  08/12/2020 OMNIPAQUE IOHEXOL 350 MG/ML SOLN COMPARISON:  Chest radiograph June 14, 2020; CT angiogram chest May 09, 2020 CT abdomen and pelvis August 11 2017 FINDINGS: CTA CHEST FINDINGS Cardiovascular: There are pulmonary emboli arising from each distal main pulmonary artery with extension of pulmonary emboli into multiple lower lobe pulmonary artery branches bilaterally. Small focus of pulmonary embolus at the origin of the right upper lobe pulmonary artery noted. The right ventricle to left ventricle diameter ratio is 1.2, indicative of a degree of right heart strain. There is no appreciable thoracic aortic aneurysm or dissection. Visualized great vessels appear normal. There are occasional foci of  coronary artery calcification. There is no appreciable pericardial effusion or pericardial thickening. Mediastinum/Nodes: Visualized thyroid appears normal. There is no evident thoracic adenopathy by size criteria. There are scattered subcentimeter mediastinal lymph nodes which do not meet  size criteria for pathologic significance. No esophageal lesions are evident. Lungs/Pleura: There is airspace opacity throughout both lower lobes with more scattered opacity in the periphery of each upper lobe and right middle lobe. There is suggestion of a degree of underlying fibrotic type change. There are no appreciable pleural effusions. Note that in comparison with most recent chest CT, there is somewhat less opacity in the upper lobes bilaterally. Musculoskeletal: There are no blastic or lytic bone lesions. No chest wall lesions. Review of the MIP images confirms the above findings. CT ABDOMEN and PELVIS FINDINGS Hepatobiliary: There is hepatic steatosis. No focal liver lesions are evident. The gallbladder wall is not appreciably thickened. There is no biliary duct dilatation. Pancreas: There is no pancreatic mass or inflammatory focus. Spleen: No splenic lesions are evident. Adrenals/Urinary Tract: Adrenals bilaterally appear normal. There is a cyst in the medial mid right kidney measuring 1.3 x 1.3 cm. There is no evident hydronephrosis on either side. There is no evident renal or ureteral calculus on either side. Urinary bladder is midline with wall thickness within normal limits. Stomach/Bowel: There is no appreciable bowel wall or mesenteric thickening. Terminal ileum appears normal. No evident bowel obstruction. There is no evident free air or portal venous air. Vascular/Lymphatic: There is aortic atherosclerosis. No aneurysm evident. Major venous structures appear patent. No adenopathy appreciable in the abdomen or pelvis. Reproductive: There are occasional prostatic calculi. Prostate and seminal vesicles normal in  size and configuration. Other: Appendix appears normal. No abscess or ascites in the abdomen or pelvis. There is again noted a left inguinal hernia containing fluid but no bowel. This appearance is stable compared to the previous study. Musculoskeletal: No blastic or lytic bone lesions. No intramuscular lesions are evident. Review of the MIP images confirms the above findings. IMPRESSION: CT angiogram chest: 1. Pulmonary emboli arise from each distal main pulmonary artery with extension into multiple lower lobe pulmonary artery branches bilaterally. Pulmonary embolus also seen in the proximal aspect of the right upper lobe pulmonary artery. Positive for acute PE with CTevidence of right heart strain (RV/LV Ratio = 1.2) consistent with at least submassive (intermediate risk) PE. The presence of right heart strain has been associated with an increased risk of morbidity and mortality. 2. Multifocal pneumonia with a lower lobe predominance. There appears to be underlying fibrotic change in the periphery of the lower lobes. Somewhat less opacity in the upper lobes compared to most recent CT. Appearance consistent with COVID 19 pneumonitis with potential degree of bacterial superinfection. 3.  Foci of coronary artery calcification noted. 4.  No appreciable adenopathy. CT abdomen and pelvis: 1. No bowel wall thickening or bowel obstruction. No abscess in the abdomen or pelvis. Appendix appears normal. 2. No appreciable renal or ureteral calculus. No hydronephrosis. Urinary bladder wall thickness normal. 3. Stable small inguinal hernia on the left containing fluid but no bowel. 4. Hepatic steatosis. 5.  Aortic Atherosclerosis (ICD10-I70.0). Critical Value/emergent results were called by telephone at the time of interpretation on 06/14/2020 at 11:42 am to provider MARK QUALE , who verbally acknowledged these results. Electronically Signed   By: Bretta Bang III M.D.   On: 06/14/2020 11:43   CT ABDOMEN PELVIS W  CONTRAST  Result Date: 06/14/2020 CLINICAL DATA:  Chest and abdominal pain EXAM: CT ANGIOGRAPHY CHEST CT ABDOMEN AND PELVIS WITH CONTRAST TECHNIQUE: Multidetector CT imaging of the chest was performed using the standard protocol during bolus administration of intravenous contrast. Multiplanar CT image reconstructions and MIPs were obtained to  evaluate the vascular anatomy. Multidetector CT imaging of the abdomen and pelvis was performed using the standard protocol during bolus administration of intravenous contrast. CONTRAST:  OMNIPAQUE IOHEXOL 350 MG/ML SOLN COMPARISON:  Chest radiograph June 14, 2020; CT angiogram chest May 09, 2020 CT abdomen and pelvis August 11 2017 FINDINGS: CTA CHEST FINDINGS Cardiovascular: There are pulmonary emboli arising from each distal main pulmonary artery with extension of pulmonary emboli into multiple lower lobe pulmonary artery branches bilaterally. Small focus of pulmonary embolus at the origin of the right upper lobe pulmonary artery noted. The right ventricle to left ventricle diameter ratio is 1.2, indicative of a degree of right heart strain. There is no appreciable thoracic aortic aneurysm or dissection. Visualized great vessels appear normal. There are occasional foci of coronary artery calcification. There is no appreciable pericardial effusion or pericardial thickening. Mediastinum/Nodes: Visualized thyroid appears normal. There is no evident thoracic adenopathy by size criteria. There are scattered subcentimeter mediastinal lymph nodes which do not meet size criteria for pathologic significance. No esophageal lesions are evident. Lungs/Pleura: There is airspace opacity throughout both lower lobes with more scattered opacity in the periphery of each upper lobe and right middle lobe. There is suggestion of a degree of underlying fibrotic type change. There are no appreciable pleural effusions. Note that in comparison with most recent chest CT, there is  somewhat less opacity in the upper lobes bilaterally. Musculoskeletal: There are no blastic or lytic bone lesions. No chest wall lesions. Review of the MIP images confirms the above findings. CT ABDOMEN and PELVIS FINDINGS Hepatobiliary: There is hepatic steatosis. No focal liver lesions are evident. The gallbladder wall is not appreciably thickened. There is no biliary duct dilatation. Pancreas: There is no pancreatic mass or inflammatory focus. Spleen: No splenic lesions are evident. Adrenals/Urinary Tract: Adrenals bilaterally appear normal. There is a cyst in the medial mid right kidney measuring 1.3 x 1.3 cm. There is no evident hydronephrosis on either side. There is no evident renal or ureteral calculus on either side. Urinary bladder is midline with wall thickness within normal limits. Stomach/Bowel: There is no appreciable bowel wall or mesenteric thickening. Terminal ileum appears normal. No evident bowel obstruction. There is no evident free air or portal venous air. Vascular/Lymphatic: There is aortic atherosclerosis. No aneurysm evident. Major venous structures appear patent. No adenopathy appreciable in the abdomen or pelvis. Reproductive: There are occasional prostatic calculi. Prostate and seminal vesicles normal in size and configuration. Other: Appendix appears normal. No abscess or ascites in the abdomen or pelvis. There is again noted a left inguinal hernia containing fluid but no bowel. This appearance is stable compared to the previous study. Musculoskeletal: No blastic or lytic bone lesions. No intramuscular lesions are evident. Review of the MIP images confirms the above findings. IMPRESSION: CT angiogram chest: 1. Pulmonary emboli arise from each distal main pulmonary artery with extension into multiple lower lobe pulmonary artery branches bilaterally. Pulmonary embolus also seen in the proximal aspect of the right upper lobe pulmonary artery. Positive for acute PE with CTevidence of right  heart strain (RV/LV Ratio = 1.2) consistent with at least submassive (intermediate risk) PE. The presence of right heart strain has been associated with an increased risk of morbidity and mortality. 2. Multifocal pneumonia with a lower lobe predominance. There appears to be underlying fibrotic change in the periphery of the lower lobes. Somewhat less opacity in the upper lobes compared to most recent CT. Appearance consistent with COVID 19 pneumonitis with potential degree  of bacterial superinfection. 3.  Foci of coronary artery calcification noted. 4.  No appreciable adenopathy. CT abdomen and pelvis: 1. No bowel wall thickening or bowel obstruction. No abscess in the abdomen or pelvis. Appendix appears normal. 2. No appreciable renal or ureteral calculus. No hydronephrosis. Urinary bladder wall thickness normal. 3. Stable small inguinal hernia on the left containing fluid but no bowel. 4. Hepatic steatosis. 5.  Aortic Atherosclerosis (ICD10-I70.0). Critical Value/emergent results were called by telephone at the time of interpretation on 06/14/2020 at 11:42 am to provider MARK QUALE , who verbally acknowledged these results. Electronically Signed   By: Bretta Bang III M.D.   On: 06/14/2020 11:43   US Venous Img Lower Bilateral (DVT)  Result Date: 06/14/2020 CLINICAL DATA:  Acute PE. EXAM: BILATERAL LOWER EXTREMITY VENOUS DOPPLER ULTRASOUND TECHNIQUE: Gray-scale sonography with compression, as well as color and duplex ultrasound, were performed to evaluate the deep venous system(s) from the level of the common femoral vein through the popliteal and proximal calf veins. COMPARISON:  None. FINDINGS: VENOUS Occlusive thrombus noted in the right posterior tibial vein and peroneal vein. Occlusive thrombus noted in the left popliteal vein, posterior tibial vein and peroneal vein. Normal compressibility of the common femoral, superficial femoral, and right popliteal veins. Visualized portions of profunda femoral  vein and great saphenous vein unremarkable. OTHER None. Limitations: none IMPRESSION: 1. Acute deep venous thrombosis of the right and left lower extremities, on the left from the popliteal through the posterior tibial and peroneal veins and on the right involving the posterior tibial and peroneal veins. Right popliteal vein and both common femoral and femoral veins widely patent. Electronically Signed   By: Amie Portland M.D.   On: 06/14/2020 13:55   DG Chest Port 1 View  Result Date: 06/14/2020 CLINICAL DATA:  Tachycardia with decreased oxygen saturation EXAM: PORTABLE CHEST 1 VIEW COMPARISON:  May 06, 2020 FINDINGS: There is patchy airspace opacity in the left mid lung region as well as in both lower lung regions. Heart size and pulmonary vascularity are normal. No adenopathy. No bone lesions. IMPRESSION: Patchy airspace opacity at several sites, more severe than on prior study. Appearance raises concern for multifocal atypical organism pneumonia. Check of COVID-19 status in this regard advised. No frank consolidation. Heart size normal. Electronically Signed   By: Bretta Bang III M.D.   On: 06/14/2020 08:54   Assessment/Plan The patient is a 60 year old male a with medical history significant of GERD, recent COVID-19 infection, who presents with shortness breath, left flank pain new diagnosis of submassive PE  1.  Pulmonary embolism: Patient with progressively worsening shortness of breath which prompted him to seek medical attention in our emergency department.  Patient with large submassive PE with associated right heart strain.  In the setting of hypoxia, with a large clot burden, tachycardia, and right heart strain recommend undergoing a pulmonary thrombectomy/thrombolysis in an attempt to lessen the clot burden and improve the patient's symptoms.  Procedure, risks and benefits were cemented with patient.  All questions were answered.  The patient wished to proceed.  2.  Need for  anticoagulation: Due to the patient's recent diagnosis of pulmonary embolism he will most likely be on anticoagulation for approximately 1 year. Patient will need to be followed by a pulmonologist  3.  Lateral lower extremity DVT: Asymptomatic at this time DVTs are distal. In the setting of being asymptomatic and distal there is no role for endovascular intervention at this time. We will be  happy to follow the patient's DVT in the outpatient setting  Discussed with Dr. Romie JumperSchnier   Zemirah Krasinski A Skiler Olden, PA-C  06/14/2020 4:35 PM  This note was created with Dragon medical transcription system.  Any error is purely unintentional

## 2020-06-14 NOTE — Consult Note (Signed)
NAME: Jack Barton  DOB: 1960/08/31  MRN: [id number]  Date/Time: 06/14/2020 2:21 PM  REQUESTING PROVIDER: Dr. Fanny Bien Subjective:  REASON FOR CONSULT: HCAP versus Covid related pneumonia  ? Jack Barton is a 60 y.o. male with a history of recent Covid infection for which she was at [facility] and then at [facility] rehab presents to the hospital with acute left sided chest pain and shortness of breath since this morning Pt had covid on 05/06/20 and then got admitted on 1/6 to [place] [facility]. As per his wife he got antiviral and then was [facility] because ehe needed oxygen. He came home on 05/30/20. Was sent home with oxygen but did not need use it., He had scarring in his lungs which he attributes to having b/l pneumonia as a 3 day old baby. Patient was seen in the pulmonologist office on 06/11/2020.  He wanted him to get a CT chest high-resolution for pulmonary fibrosis and pulmonary function test and a 6-minute walk test along with echocardiogram. He presented to the ED today on 06/14/2020 with shortness of breath. He also was complaining of severe and worsening pain in his left lower lip cage and left upper abdomen.  Vitals in the ED was 139/96, heart rate 128, respiratory rate 32, temperature 99.5.  Covid test was negative Procalcitonin was less than 0.10 Chest x-ray showed patchy opacity in the left midlung region as well as in both the lower lung regions.  He was started on broad-spectrum antibiotics. CT scan of the chest revealed Multiple pulmonary emboli arising from each distal main pulmonary artery with extension of the pulmonary emboli to the multiple lower lobe pulmonary artery branches bilaterally.  The right anterior ventricle the left ventricular diameter was 1.2 indicative of a degree of right heart strain.  There was airspace opacity throughout both lower lobes with more scattered opacity in the periphery of each upper lobe and right middle lobe.  There are suggestion of a degree of  underlying fibrotic type changes. He was taken by vascular for thrombectomy and TPA I am asked to see him to r/o pneumonia   Past Medical History:  Diagnosis Date  . COVID-19 virus infection   . GERD (gastroesophageal reflux disease)     Past Surgical History:  Procedure Laterality Date  . HERNIA REPAIR      Social History   Socioeconomic History  . Marital status: Married    Spouse name: Not on file  . Number of children: Not on file  . Years of education: Not on file  . Highest education level: Not on file  Occupational History  . Not on file  Tobacco Use  . Smoking status: Never Smoker  . Smokeless tobacco: Never Used  Substance and Sexual Activity  . Alcohol use: Never  . Drug use: Never  . Sexual activity: Not on file  Other Topics Concern  . Not on file  Social History Narrative  . Not on file   Social Determinants of Health   Financial Resource Strain: Not on file  Food Insecurity: Not on file  Transportation Needs: Not on file  Physical Activity: Not on file  Stress: Not on file  Social Connections: Not on file  Intimate Partner Violence: Not on file    Family History  Problem Relation Age of Onset  . Ovarian cancer Mother    No Known Allergies  ? Current Facility-Administered Medications  Medication Dose Route Frequency Provider Last Rate Last Admin  . acetaminophen (TYLENOL) tablet  650 mg  650 mg Oral Q6H PRN Lorretta Harp, MD      . albuterol (PROVENTIL) (2.5 MG/3ML) 0.083% nebulizer solution 2.5 mg  2.5 mg Nebulization Q4H PRN Lorretta Harp, MD      . ceFEPIme (MAXIPIME) 2 g in sodium chloride 0.9 % 100 mL IVPB  2 g Intravenous Q8H Hallaji, Sheema M, RPH      . dextromethorphan-guaiFENesin (MUCINEX DM) 30-600 MG per 12 hr tablet 1 tablet  1 tablet Oral BID PRN Lorretta Harp, MD      . heparin ADULT infusion 100 units/mL (25000 units/21mL)  1,800 Units/hr Intravenous Continuous Lorretta Harp, MD 18 mL/hr at 06/14/20 1252 1,800 Units/hr at 06/14/20 1252  .  ipratropium-albuterol (DUONEB) 0.5-2.5 (3) MG/3ML nebulizer solution 3 mL  3 mL Nebulization Q4H Lorretta Harp, MD   3 mL at 06/14/20 1255  . lactated ringers infusion   Intravenous Continuous Lorretta Harp, MD 100 mL/hr at 06/14/20 1245 Rate Change at 06/14/20 1245  . morphine 2 MG/ML injection 2 mg  2 mg Intravenous Q4H PRN Lorretta Harp, MD   2 mg at 06/14/20 1248  . ondansetron (ZOFRAN) injection 4 mg  4 mg Intravenous Q8H PRN Lorretta Harp, MD      . oxyCODONE-acetaminophen (PERCOCET/ROXICET) 5-325 MG per tablet 1 tablet  1 tablet Oral Q4H PRN Lorretta Harp, MD      . vancomycin (VANCOREADY) IVPB 1250 mg/250 mL  1,250 mg Intravenous Q8H Hallaji, Sheema M, [facility]       Current Outpatient Medications  Medication Sig Dispense Refill  . cefdinir (OMNICEF) 300 MG capsule Take 300 mg by mouth 2 (two) times daily.    . pantoprazole (PROTONIX) 40 MG tablet Take 40 mg by mouth daily.       Abtx:  Anti-infectives (From admission, onward)   Start     Dose/Rate Route Frequency Ordered Stop   06/14/20 2200  ceFEPIme (MAXIPIME) 2 g in sodium chloride 0.9 % 100 mL IVPB        2 g 200 mL/hr over 30 Minutes Intravenous Every 8 hours 06/14/20 1218     06/14/20 2200  vancomycin (VANCOREADY) IVPB 1250 mg/250 mL        1,250 mg 166.7 mL/hr over 90 Minutes Intravenous Every 8 hours 06/14/20 1231     06/14/20 1230  vancomycin (VANCOCIN) IVPB 1000 mg/200 mL premix        1,000 mg 200 mL/hr over 60 Minutes Intravenous  Once 06/14/20 1218 06/14/20 1348   06/14/20 0900  vancomycin (VANCOCIN) IVPB 1000 mg/200 mL premix        1,000 mg 200 mL/hr over 60 Minutes Intravenous  Once 06/14/20 0854 06/14/20 1059   06/14/20 0900  ceFEPIme (MAXIPIME) 2 g in sodium chloride 0.9 % 100 mL IVPB        2 g 200 mL/hr over 30 Minutes Intravenous  Once 06/14/20 0854 06/14/20 0958      REVIEW OF SYSTEMS:  Const: negative fever, negative chills, + weight loss Eyes: negative diplopia or visual changes, negative eye pain ENT: negative  coryza, negative sore throat Resp: cough, no hemoptysis, dyspnea Cards:+ chest pain, palpitations, no lower extremity edema GU: negative for frequency, dysuria and hematuria GI: left upper quadrant pain Skin: negative for rash and pruritus Heme: negative for easy bruising and gum/nose bleeding MS: negative for myalgias, arthralgias, back pain and muscle weakness Neurolo:negative for headaches, dizziness, vertigo, memory problems  Psych: negative for feelings of anxiety, depression  Endocrine: negative for thyroid, diabetes  Allergy/Immunology- negative for any medication or food allergies ?  Objective:  VITALS:  BP 130/87   Pulse (!) 119   Temp 99.9 F (37.7 C) (Oral)   Resp (!) 29   Ht 6\' 4"  (1.93 m)   Wt 104.3 kg   SpO2 95%   BMI 28.00 kg/m  PHYSICAL EXAM:  General: Alert, cooperative, resp distress, appears stated age.  Head: Normocephalic, without obvious abnormality, atraumatic. Eyes: Conjunctivae clear, anicteric sclerae. Pupils are equal ENT Nares normal. No drainage or sinus tenderness. Lips, mucosa, and tongue normal. No Thrush Neck: Supple, symmetrical, no adenopathy, thyroid: non tender no carotid bruit and no JVD. Back: No CVA tenderness. Lungs: b/l air entry- crepts bases Heart: tachycardia Abdomen: Soft, non-tender,not distended. Bowel sounds normal. No masses Extremities: atraumatic, no cyanosis. No edema. No clubbing Skin: No rashes or lesions. Or bruising Lymph: Cervical, supraclavicular normal. Neurologic: Grossly non-focal Pertinent Labs Lab Results CBC    Component Value Date/Time   WBC 11.3 (H) 06/14/2020 0909   RBC 4.49 06/14/2020 0909   HGB 13.8 06/14/2020 0909   HCT 39.9 06/14/2020 0909   PLT 255 06/14/2020 0909   MCV 88.9 06/14/2020 0909   MCH 30.7 06/14/2020 0909   MCHC 34.6 06/14/2020 0909   RDW 12.9 06/14/2020 0909   LYMPHSABS 1.6 06/14/2020 0909   MONOABS 0.9 06/14/2020 0909   EOSABS 0.1 06/14/2020 0909   BASOSABS 0.1 06/14/2020  0909    CMP Latest Ref Rng & Units 06/14/2020  Glucose 70 - 99 mg/dL 716(R)  BUN 6 - 20 mg/dL 13  Creatinine 6.78 - 9.38 mg/dL 1.01  Sodium 751 - 025 mmol/L 133(L)  Potassium 3.5 - 5.1 mmol/L 4.0  Chloride 98 - 111 mmol/L 101  CO2 22 - 32 mmol/L 23  Calcium 8.9 - 10.3 mg/dL 8.5(I)  Total Protein 6.5 - 8.1 g/dL 7.5  Total Bilirubin 0.3 - 1.2 mg/dL 1.2  Alkaline Phos 38 - 126 U/L 91  AST 15 - 41 U/L 20  ALT 0 - 44 U/L 17      Microbiology: Recent Results (from the past 240 hour(s))  Resp Panel by RT-PCR (Flu A&B, Covid) Nasopharyngeal Swab     Status: None   Collection Time: 06/14/20  9:12 AM   Specimen: Nasopharyngeal Swab; Nasopharyngeal(NP) swabs in vial transport medium  Result Value Ref Range Status   SARS Coronavirus 2 by RT PCR NEGATIVE NEGATIVE Final    Comment: (NOTE) SARS-CoV-2 target nucleic acids are NOT DETECTED.  The SARS-CoV-2 RNA is generally detectable in upper respiratory specimens during the acute phase of infection. The lowest concentration of SARS-CoV-2 viral copies this assay can detect is 138 copies/mL. A negative result does not preclude SARS-Cov-2 infection and should not be used as the sole basis for treatment or other patient management decisions. A negative result may occur with  improper specimen collection/handling, submission of specimen other than nasopharyngeal swab, presence of viral mutation(s) within the areas targeted by this assay, and inadequate number of viral copies(<138 copies/mL). A negative result must be combined with clinical observations, patient history, and epidemiological information. The expected result is Negative.  Fact Sheet for Patients:  BloggerCourse.com  Fact Sheet for Healthcare Providers:  SeriousBroker.it  This test is no t yet approved or cleared by the [place] FDA and  has been authorized for detection and/or diagnosis of SARS-CoV-2 by FDA under an  Emergency Use Authorization (EUA). This EUA will remain  in effect (meaning this test can be used) for the  duration of the COVID-19 declaration under Section 564(b)(1) of the Act, 21 U.S.C.section 360bbb-3(b)(1), unless the authorization is terminated  or revoked sooner.       Influenza A by PCR NEGATIVE NEGATIVE Final   Influenza B by PCR NEGATIVE NEGATIVE Final    Comment: (NOTE) The Xpert Xpress SARS-CoV-2/FLU/RSV plus assay is intended as an aid in the diagnosis of influenza from Nasopharyngeal swab specimens and should not be used as a sole basis for treatment. Nasal washings and aspirates are unacceptable for Xpert Xpress SARS-CoV-2/FLU/RSV testing.  Fact Sheet for Patients: BloggerCourse.com  Fact Sheet for Healthcare Providers: SeriousBroker.it  This test is not yet approved or cleared by the [place] FDA and has been authorized for detection and/or diagnosis of SARS-CoV-2 by FDA under an Emergency Use Authorization (EUA). This EUA will remain in effect (meaning this test can be used) for the duration of the COVID-19 declaration under Section 564(b)(1) of the Act, 21 U.S.C. section 360bbb-3(b)(1), unless the authorization is terminated or revoked.  Performed at [facility], [address] [place], [place] [address]     IMAGING RESULTS:  I have personally reviewed the films ? Impression/Recommendation ? Presenting with sob, left sided chest pain and hypoxia  Acute hypoxia due to Pulmonary embolism with rt heart strain Underwent Selective injection right subsegmental pulmonary arteries             2.  Selective injection left subsegmental pulmonary artery             3.  Infusion of TPA for thrombolysis             4.  Mechanical thrombectomy pulmonary emboli using the penumbra CAT 8 lightening device  Post covid pulmonary infiltrate with underlying fibrosis Doubt this is bacterial pneumonia-  procal < 0.10 Will send MRSA nares May DC antibiotics if stable tomorrow and repeat procal Low   ? ___________________________________________________ Discussed with patient, and his wife and requesting provider ID will follow him peripherally this weekend. Call if needed Note:  This document was prepared using Dragon voice recognition software and may include unintentional dictation errors.

## 2020-06-14 NOTE — Consult Note (Incomplete)
NAME: Jack Barton  DOB: 1960/08/31  MRN: [id number]  Date/Time: 06/14/2020 2:21 PM  REQUESTING PROVIDER: Dr. Fanny Bien Subjective:  REASON FOR CONSULT: HCAP versus Covid related pneumonia  ? Jack Barton is a 60 y.o. male with a history of recent Covid infection for which she was at [facility] and then at [facility] rehab presents to the hospital with acute left sided chest pain and shortness of breath since this morning Pt had covid on 05/06/20 and then got admitted on 1/6 to [place] [facility]. As per his wife he got antiviral and then was sent to [facility] because ehe needed oxygen. He came home on 05/30/20. Was sent home with oxygen but did not need use it., He had scarring in his lungs which he attributes to having b/l pneumonia as a 3 day old baby. Patient was seen in the pulmonologist office on 06/11/2020.  He wanted him to get a CT chest high-resolution for pulmonary fibrosis and pulmonary function test and a 6-minute walk test along with echocardiogram. He presented to the ED today on 06/14/2020 with shortness of breath. He also was complaining of severe and worsening pain in his left lower lip cage and left upper abdomen.  Vitals in the ED was 139/96, heart rate 128, respiratory rate 32, temperature 99.5.  Covid test was negative Procalcitonin was less than 0.10 Chest x-ray showed patchy opacity in the left midlung region as well as in both the lower lung regions.  He was started on broad-spectrum antibiotics. CT scan of the chest revealed Multiple pulmonary emboli arising from each distal main pulmonary artery with extension of the pulmonary emboli to the multiple lower lobe pulmonary artery branches bilaterally.  The right anterior ventricle the left ventricular diameter was 1.2 indicative of a degree of right heart strain.  There was airspace opacity throughout both lower lobes with more scattered opacity in the periphery of each upper lobe and right middle lobe.  There are suggestion of a degree of  underlying fibrotic type changes. He was taken by vascular for thrombectomy and TPA I am asked to see him to r/o pneumonia   Past Medical History:  Diagnosis Date  . COVID-19 virus infection   . GERD (gastroesophageal reflux disease)     Past Surgical History:  Procedure Laterality Date  . HERNIA REPAIR      Social History   Socioeconomic History  . Marital status: Married    Spouse name: Not on file  . Number of children: Not on file  . Years of education: Not on file  . Highest education level: Not on file  Occupational History  . Not on file  Tobacco Use  . Smoking status: Never Smoker  . Smokeless tobacco: Never Used  Substance and Sexual Activity  . Alcohol use: Never  . Drug use: Never  . Sexual activity: Not on file  Other Topics Concern  . Not on file  Social History Narrative  . Not on file   Social Determinants of Health   Financial Resource Strain: Not on file  Food Insecurity: Not on file  Transportation Needs: Not on file  Physical Activity: Not on file  Stress: Not on file  Social Connections: Not on file  Intimate Partner Violence: Not on file    Family History  Problem Relation Age of Onset  . Ovarian cancer Mother    No Known Allergies  ? Current Facility-Administered Medications  Medication Dose Route Frequency Provider Last Rate Last Admin  . acetaminophen (TYLENOL) tablet  650 mg  650 mg Oral Q6H PRN Lorretta Harp, MD      . albuterol (PROVENTIL) (2.5 MG/3ML) 0.083% nebulizer solution 2.5 mg  2.5 mg Nebulization Q4H PRN Lorretta Harp, MD      . ceFEPIme (MAXIPIME) 2 g in sodium chloride 0.9 % 100 mL IVPB  2 g Intravenous Q8H Hallaji, Sheema M, RPH      . dextromethorphan-guaiFENesin (MUCINEX DM) 30-600 MG per 12 hr tablet 1 tablet  1 tablet Oral BID PRN Lorretta Harp, MD      . heparin ADULT infusion 100 units/mL (25000 units/216mL)  1,800 Units/hr Intravenous Continuous Lorretta Harp, MD 18 mL/hr at 06/14/20 1252 1,800 Units/hr at 06/14/20 1252  .  ipratropium-albuterol (DUONEB) 0.5-2.5 (3) MG/3ML nebulizer solution 3 mL  3 mL Nebulization Q4H Lorretta Harp, MD   3 mL at 06/14/20 1255  . lactated ringers infusion   Intravenous Continuous Lorretta Harp, MD 100 mL/hr at 06/14/20 1245 Rate Change at 06/14/20 1245  . morphine 2 MG/ML injection 2 mg  2 mg Intravenous Q4H PRN Lorretta Harp, MD   2 mg at 06/14/20 1248  . ondansetron (ZOFRAN) injection 4 mg  4 mg Intravenous Q8H PRN Lorretta Harp, MD      . oxyCODONE-acetaminophen (PERCOCET/ROXICET) 5-325 MG per tablet 1 tablet  1 tablet Oral Q4H PRN Lorretta Harp, MD      . vancomycin (VANCOREADY) IVPB 1250 mg/250 mL  1,250 mg Intravenous Q8H Hallaji, Sheema M, [facility]       Current Outpatient Medications  Medication Sig Dispense Refill  . cefdinir (OMNICEF) 300 MG capsule Take 300 mg by mouth 2 (two) times daily.    . pantoprazole (PROTONIX) 40 MG tablet Take 40 mg by mouth daily.       Abtx:  Anti-infectives (From admission, onward)   Start     Dose/Rate Route Frequency Ordered Stop   06/14/20 2200  ceFEPIme (MAXIPIME) 2 g in sodium chloride 0.9 % 100 mL IVPB        2 g 200 mL/hr over 30 Minutes Intravenous Every 8 hours 06/14/20 1218     06/14/20 2200  vancomycin (VANCOREADY) IVPB 1250 mg/250 mL        1,250 mg 166.7 mL/hr over 90 Minutes Intravenous Every 8 hours 06/14/20 1231     06/14/20 1230  vancomycin (VANCOCIN) IVPB 1000 mg/200 mL premix        1,000 mg 200 mL/hr over 60 Minutes Intravenous  Once 06/14/20 1218 06/14/20 1348   06/14/20 0900  vancomycin (VANCOCIN) IVPB 1000 mg/200 mL premix        1,000 mg 200 mL/hr over 60 Minutes Intravenous  Once 06/14/20 0854 06/14/20 1059   06/14/20 0900  ceFEPIme (MAXIPIME) 2 g in sodium chloride 0.9 % 100 mL IVPB        2 g 200 mL/hr over 30 Minutes Intravenous  Once 06/14/20 0854 06/14/20 0958      REVIEW OF SYSTEMS:  Const: negative fever, negative chills, + weight loss Eyes: negative diplopia or visual changes, negative eye pain ENT: negative  coryza, negative sore throat Resp: cough, no hemoptysis, dyspnea Cards:+ chest pain, palpitations, no lower extremity edema GU: negative for frequency, dysuria and hematuria GI: Negative for abdominal pain, diarrhea, bleeding, constipation Skin: negative for rash and pruritus Heme: negative for easy bruising and gum/nose bleeding MS: negative for myalgias, arthralgias, back pain and muscle weakness Neurolo:negative for headaches, dizziness, vertigo, memory problems  Psych: negative for feelings of anxiety, depression  Endocrine: negative  for thyroid, diabetes Allergy/Immunology- negative for any medication or food allergies ? Pertinent Positives include : Objective:  VITALS:  BP 130/87   Pulse (!) 119   Temp 99.9 F (37.7 C) (Oral)   Resp (!) 29   Ht 6\' 4"  (1.93 m)   Wt 104.3 kg   SpO2 95%   BMI 28.00 kg/m  PHYSICAL EXAM:  General: Alert, cooperative, no distress, appears stated age.  Head: Normocephalic, without obvious abnormality, atraumatic. Eyes: Conjunctivae clear, anicteric sclerae. Pupils are equal ENT Nares normal. No drainage or sinus tenderness. Lips, mucosa, and tongue normal. No Thrush Neck: Supple, symmetrical, no adenopathy, thyroid: non tender no carotid bruit and no JVD. Back: No CVA tenderness. Lungs: Clear to auscultation bilaterally. No Wheezing or Rhonchi. No rales. Heart: Regular rate and rhythm, no murmur, rub or gallop. Abdomen: Soft, non-tender,not distended. Bowel sounds normal. No masses Extremities: atraumatic, no cyanosis. No edema. No clubbing Skin: No rashes or lesions. Or bruising Lymph: Cervical, supraclavicular normal. Neurologic: Grossly non-focal Pertinent Labs Lab Results CBC    Component Value Date/Time   WBC 11.3 (H) 06/14/2020 0909   RBC 4.49 06/14/2020 0909   HGB 13.8 06/14/2020 0909   HCT 39.9 06/14/2020 0909   PLT 255 06/14/2020 0909   MCV 88.9 06/14/2020 0909   MCH 30.7 06/14/2020 0909   MCHC 34.6 06/14/2020 0909   RDW  12.9 06/14/2020 0909   LYMPHSABS 1.6 06/14/2020 0909   MONOABS 0.9 06/14/2020 0909   EOSABS 0.1 06/14/2020 0909   BASOSABS 0.1 06/14/2020 0909    CMP Latest Ref Rng & Units 06/14/2020  Glucose 70 - 99 mg/dL 08/12/2020)  BUN 6 - 20 mg/dL 13  Creatinine 435(W - 8.61 mg/dL 6.83  Sodium 7.29 - 021 mmol/L 133(L)  Potassium 3.5 - 5.1 mmol/L 4.0  Chloride 98 - 111 mmol/L 101  CO2 22 - 32 mmol/L 23  Calcium 8.9 - 10.3 mg/dL 115)  Total Protein 6.5 - 8.1 g/dL 7.5  Total Bilirubin 0.3 - 1.2 mg/dL 1.2  Alkaline Phos 38 - 126 U/L 91  AST 15 - 41 U/L 20  ALT 0 - 44 U/L 17      Microbiology: Recent Results (from the past 240 hour(s))  Resp Panel by RT-PCR (Flu A&B, Covid) Nasopharyngeal Swab     Status: None   Collection Time: 06/14/20  9:12 AM   Specimen: Nasopharyngeal Swab; Nasopharyngeal(NP) swabs in vial transport medium  Result Value Ref Range Status   SARS Coronavirus 2 by RT PCR NEGATIVE NEGATIVE Final    Comment: (NOTE) SARS-CoV-2 target nucleic acids are NOT DETECTED.  The SARS-CoV-2 RNA is generally detectable in upper respiratory specimens during the acute phase of infection. The lowest concentration of SARS-CoV-2 viral copies this assay can detect is 138 copies/mL. A negative result does not preclude SARS-Cov-2 infection and should not be used as the sole basis for treatment or other patient management decisions. A negative result may occur with  improper specimen collection/handling, submission of specimen other than nasopharyngeal swab, presence of viral mutation(s) within the areas targeted by this assay, and inadequate number of viral copies(<138 copies/mL). A negative result must be combined with clinical observations, patient history, and epidemiological information. The expected result is Negative.  Fact Sheet for Patients:  08/12/20  Fact Sheet for Healthcare Providers:  BloggerCourse.com  This test is no  t yet approved or cleared by the [place] FDA and  has been authorized for detection and/or diagnosis of SARS-CoV-2 by FDA under an  Emergency Use Authorization (EUA). This EUA will remain  in effect (meaning this test can be used) for the duration of the COVID-19 declaration under Section 564(b)(1) of the Act, 21 U.S.C.section 360bbb-3(b)(1), unless the authorization is terminated  or revoked sooner.       Influenza A by PCR NEGATIVE NEGATIVE Final   Influenza B by PCR NEGATIVE NEGATIVE Final    Comment: (NOTE) The Xpert Xpress SARS-CoV-2/FLU/RSV plus assay is intended as an aid in the diagnosis of influenza from Nasopharyngeal swab specimens and should not be used as a sole basis for treatment. Nasal washings and aspirates are unacceptable for Xpert Xpress SARS-CoV-2/FLU/RSV testing.  Fact Sheet for Patients: BloggerCourse.com  Fact Sheet for Healthcare Providers: SeriousBroker.it  This test is not yet approved or cleared by the [place] FDA and has been authorized for detection and/or diagnosis of SARS-CoV-2 by FDA under an Emergency Use Authorization (EUA). This EUA will remain in effect (meaning this test can be used) for the duration of the COVID-19 declaration under Section 564(b)(1) of the Act, 21 U.S.C. section 360bbb-3(b)(1), unless the authorization is terminated or revoked.  Performed at [facility], [address] [place], [place] [address]     IMAGING RESULTS: I have personally reviewed the films ? Impression/Recommendation ? Presenting with sob, left sided chest pain and hypoxia  Acute hypoxia due to Pulmonary embolism with rt heart strain Underwent Selective injection right subsegmental pulmonary arteries             2.  Selective injection left subsegmental pulmonary artery             3.  Infusion of TPA for thrombolysis             4.  Mechanical thrombectomy pulmonary emboli  using the penumbra CAT 8 lightening device  Post covid pulmonary infiltrate with underlying fibrosis Doubt this is pneumonia- procal < 0.10 MRSA nares Would DC antibiotics if stable tomorrow and repeat procal Low   ? ___________________________________________________ Discussed with patient, requesting provider Note:  This document was prepared using Dragon voice recognition software and may include unintentional dictation errors.

## 2020-06-14 NOTE — Progress Notes (Signed)
ANTICOAGULATION CONSULT NOTE - Initial Consult  Pharmacy Consult for Heparin drip Indication: pulmonary embolus  No Known Allergies  Patient Measurements: Height: 6\' 4"  (193 cm) Weight: 104.3 kg (230 lb) IBW/kg (Calculated) : 86.8 Heparin Dosing Weight: 104 kg  Vital Signs: Temp: 99.9 F (37.7 C) (02/11 0925) Temp Source: Oral (02/11 0925) BP: 140/95 (02/11 1130) Pulse Rate: 121 (02/11 1130)  Labs: Recent Labs    06/14/20 0909 06/14/20 0913  HGB 13.8  --   HCT 39.9  --   PLT 255  --   APTT 36  --   LABPROT 13.7  --   INR 1.1  --   CREATININE  --  0.72  TROPONINIHS  --  4    Estimated Creatinine Clearance: 131.9 mL/min (by C-G formula based on SCr of 0.72 mg/dL).   Medical History: Past Medical History:  Diagnosis Date  . COVID-19 virus infection   . GERD (gastroesophageal reflux disease)     Medications:  Scheduled:  . heparin  7,000 Units Intravenous Once  . ipratropium-albuterol  3 mL Nebulization Q4H   Infusions:  . ceFEPime (MAXIPIME) IV    . heparin    . lactated ringers 150 mL/hr at 06/14/20 1014  . vancomycin    . vancomycin      Assessment: 60 yo M to start Heparin drip for Pulm.Embolism with R heart strain.  No anticaog PTA per Med Rec Hgb 13.8  Plt 255 INR 1.1  APTT 36 -patient w/ recent Covid-diagnosed 05/06/20 and hospitalized at [facility] then rehab at [facility].  Goal of Therapy:  Heparin level 0.3-0.7 units/ml Monitor platelets by anticoagulation protocol: Yes   Plan:  (bolus~ 70 units/kg with Heparin dosing weight of 104 kg). Bolus range for PE: 60-80 units/kg) Give 7000 units bolus x 1 Start heparin infusion at 1800 units/hr Check anti-Xa level in 6 hours and daily while on heparin Continue to monitor H&H and platelets  Shamere Campas A 06/14/2020,12:44 PM

## 2020-06-14 NOTE — ED Notes (Signed)
US at bedside

## 2020-06-14 NOTE — H&P (View-Only) (Signed)
 [facility] Vascular Consult Note  MRN : [id number]  Jack Barton is a 59 y.o. (06/19/1960) male who presents with chief complaint of  Chief Complaint  Patient presents with  . Shortness of Breath   History of Present Illness:  The patient is a 59 year old male a with medical history significant of GERD, recent COVID-19 infection, who presents with shortness breath, left flank pain.  Patient was recently hospitalized due to COVID-19 infection and discharged on 1/27 from [facility].  Patient states that he developed left flank pain in left back and left rib cage since last night.  The pain is pleuritic, sharp, severe, nonradiating, aggravated by deep breath.  He has a dry cough, no fever or chills.  His shortness breath has worsened. Pt states that this morning his O2 sat was low and his heart rate was fast.  No nausea vomiting, diarrhea or abdominal pain.  No symptoms of UTI. Upon arrival to ED, patient was noted to have acute respiratory distress with hyperventilation. Patient was found to have oxygen desaturated to 85% on room air, which improved to 95% on 4 L oxygen.  CTA chest (06/14/20): 1. Pulmonary emboli arise from each distal main pulmonary artery with extension into multiple lower lobe pulmonary artery branches bilaterally. Pulmonary embolus also seen in the proximal aspect of the right upper lobe pulmonary artery. Positive for acute PE with CTevidence of right heart strain (RV/LV Ratio = 1.2) consistent with at least submassive (intermediate risk) PE. The presence of right heart strain has been associated with an increased risk of morbidity and mortality.  Vascular surgery was consulted by Dr. Niu for possible thrombectomy / thrombolysis.  Current Facility-Administered Medications  Medication Dose Route Frequency Provider Last Rate Last Admin  . 0.9 %  sodium chloride infusion   Intravenous Continuous Johney Perotti A, PA-C      . [MAR Hold]  acetaminophen (TYLENOL) tablet 650 mg  650 mg Oral Q6H PRN Niu, Xilin, MD      . [MAR Hold] albuterol (PROVENTIL) (2.5 MG/3ML) 0.083% nebulizer solution 2.5 mg  2.5 mg Nebulization Q4H PRN Niu, Xilin, MD      . [MAR Hold] ceFEPIme (MAXIPIME) 2 g in sodium chloride 0.9 % 100 mL IVPB  2 g Intravenous Q8H Hallaji, Sheema M, RPH      . [START ON 06/15/2020] clindamycin (CLEOCIN) IVPB 300 mg  300 mg Intravenous Once Maloni Musleh A, PA-C      . [MAR Hold] dextromethorphan-guaiFENesin (MUCINEX DM) 30-600 MG per 12 hr tablet 1 tablet  1 tablet Oral BID PRN Niu, Xilin, MD      . diphenhydrAMINE (BENADRYL) injection 50 mg  50 mg Intravenous Once PRN Candiace West A, PA-C      . famotidine (PEPCID) tablet 40 mg  40 mg Oral Once PRN Gyasi Hazzard A, PA-C      . heparin ADULT infusion 100 units/mL (25000 units/250mL)  1,800 Units/hr Intravenous Continuous Niu, Xilin, MD   Stopped at 06/14/20 1545  . heparin sodium (porcine) 1000 UNIT/ML injection           . heparin sodium (porcine) injection    PRN Schnier, Gregory G, MD   2,000 Units at 06/14/20 1625  . [MAR Hold] HYDROmorphone (DILAUDID) injection 1 mg  1 mg Intravenous Once PRN Thurmon Mizell A, PA-C      . [MAR Hold] ipratropium-albuterol (DUONEB) 0.5-2.5 (3) MG/3ML nebulizer solution 3 mL  3 mL Nebulization Q4H Niu, Xilin, MD     3 mL at 06/14/20 1255  . lactated ringers infusion   Intravenous Continuous Niu, Xilin, MD 100 mL/hr at 06/14/20 1245 Rate Change at 06/14/20 1245  . methylPREDNISolone sodium succinate (SOLU-MEDROL) 125 mg/2 mL injection 125 mg  125 mg Intravenous Once PRN Deara Bober A, PA-C      . midazolam (VERSED) 2 MG/ML syrup 8 mg  8 mg Oral Once PRN Jacy Howat A, PA-C      . [MAR Hold] morphine 2 MG/ML injection 2 mg  2 mg Intravenous Q4H PRN Niu, Xilin, MD   2 mg at 06/14/20 1248  . [MAR Hold] ondansetron (ZOFRAN) injection 4 mg  4 mg Intravenous Q8H PRN Niu, Xilin, MD      . [MAR Hold] ondansetron  (ZOFRAN) injection 4 mg  4 mg Intravenous Q6H PRN Aedon Deason A, PA-C      . [MAR Hold] oxyCODONE-acetaminophen (PERCOCET/ROXICET) 5-325 MG per tablet 1 tablet  1 tablet Oral Q4H PRN Niu, Xilin, MD      . [MAR Hold] pantoprazole (PROTONIX) EC tablet 40 mg  40 mg Oral Daily Niu, Xilin, MD      . [MAR Hold] vancomycin (VANCOREADY) IVPB 1250 mg/250 mL  1,250 mg Intravenous Q8H Hallaji, Sheema M, RPH       Past Medical History:  Diagnosis Date  . COVID-19 virus infection   . GERD (gastroesophageal reflux disease)    Past Surgical History:  Procedure Laterality Date  . HERNIA REPAIR     Social History Social History   Tobacco Use  . Smoking status: Never Smoker  . Smokeless tobacco: Never Used  Substance Use Topics  . Alcohol use: Never  . Drug use: Never   Family History Family History  Problem Relation Age of Onset  . Ovarian cancer Mother   Denies family history of peripheral artery disease, venous disease or bleeding/clotting disorders.  No Known Allergies  REVIEW OF SYSTEMS (Negative unless checked)  Constitutional: []Weight loss  []Fever  []Chills Cardiac: []Chest pain   []Chest pressure   []Palpitations   []Shortness of breath when laying flat   []Shortness of breath at rest   []Shortness of breath with exertion. Vascular:  []Pain in legs with walking   []Pain in legs at rest   []Pain in legs when laying flat   []Claudication   []Pain in feet when walking  []Pain in feet at rest  []Pain in feet when laying flat   []History of DVT   []Phlebitis   []Swelling in legs   []Varicose veins   []Non-healing ulcers Pulmonary:   []Uses home oxygen   []Productive cough   []Hemoptysis   []Wheeze  []COPD   []Asthma Neurologic:  []Dizziness  []Blackouts   []Seizures   []History of stroke   []History of TIA  []Aphasia   []Temporary blindness   []Dysphagia   []Weakness or numbness in arms   []Weakness or numbness in legs Musculoskeletal:  []Arthritis   []Joint swelling   []Joint pain    []Low back pain Hematologic:  []Easy bruising  []Easy bleeding   []Hypercoagulable state   []Anemic  []Hepatitis Gastrointestinal:  []Blood in stool   []Vomiting blood  []Gastroesophageal reflux/heartburn   []Difficulty swallowing. Genitourinary:  []Chronic kidney disease   []Difficult urination  []Frequent urination  []Burning with urination   []Blood in urine Skin:  []Rashes   []Ulcers   []Wounds Psychological:  []History of anxiety   [] History of major depression.  Physical Examination  Vitals:   06/14/20 1430   06/14/20 1500 06/14/20 1538 06/14/20 1613  BP: 120/85 (!) 141/85 135/83   Pulse: (!) 116 (!) 117 (!) 125   Resp: (!) 33 (!) 30 (!) 38   Temp:      TempSrc:      SpO2: 93% 93% 93% 95%  Weight:   104.3 kg   Height:   6' 4" (1.93 m)    Body mass index is 27.99 kg/m. Gen:  WD/WN, labored breathing Head: [place]/AT, No temporalis wasting. Prominent temp pulse not noted. Ear/Nose/Throat: Hearing grossly intact, nares w/o erythema or drainage, oropharynx w/o Erythema/Exudate Eyes: Sclera non-icteric, conjunctiva clear Neck: Trachea midline.  No JVD.  Pulmonary: Labored breathing Cardiac: RRR, normal S1, S2. Vascular:  Vessel Right Left  Radial Palpable Palpable  Ulnar Palpable Palpable  Brachial Palpable Palpable  Carotid Palpable, without bruit Palpable, without bruit  Aorta Not palpable N/A  Femoral Palpable Palpable  Popliteal Palpable Palpable  PT Palpable Palpable  DP Palpable Palpable   Gastrointestinal: soft, non-tender/non-distended. No guarding/reflex.  Musculoskeletal: M/S 5/5 throughout.  Extremities without ischemic changes.  No deformity or atrophy. No edema. Neurologic: Sensation grossly intact in extremities.  Symmetrical.  Speech is fluent. Motor exam as listed above. Psychiatric: Judgment intact, Mood & affect appropriate for pt's clinical situation. Dermatologic: No rashes or ulcers noted.  No cellulitis or open wounds. Lymph : No Cervical, Axillary, or  Inguinal lymphadenopathy.  CBC Lab Results  Component Value Date   WBC 11.3 (H) 06/14/2020   HGB 13.8 06/14/2020   HCT 39.9 06/14/2020   MCV 88.9 06/14/2020   PLT 255 06/14/2020   BMET    Component Value Date/Time   NA 133 (L) 06/14/2020 0913   K 4.0 06/14/2020 0913   CL 101 06/14/2020 0913   CO2 23 06/14/2020 0913   GLUCOSE 120 (H) 06/14/2020 0913   BUN 13 06/14/2020 0913   CREATININE 0.72 06/14/2020 0913   CALCIUM 8.7 (L) 06/14/2020 0913   GFRNONAA >60 06/14/2020 0913   Estimated Creatinine Clearance: 131.9 mL/min (by C-G formula based on SCr of 0.72 mg/dL).  COAG Lab Results  Component Value Date   INR 1.1 06/14/2020   Radiology CT Angio Chest PE W and/or Wo Contrast  Result Date: 06/14/2020 CLINICAL DATA:  Chest and abdominal pain EXAM: CT ANGIOGRAPHY CHEST CT ABDOMEN AND PELVIS WITH CONTRAST TECHNIQUE: Multidetector CT imaging of the chest was performed using the standard protocol during bolus administration of intravenous contrast. Multiplanar CT image reconstructions and MIPs were obtained to evaluate the vascular anatomy. Multidetector CT imaging of the abdomen and pelvis was performed using the standard protocol during bolus administration of intravenous contrast. CONTRAST:  100mL OMNIPAQUE IOHEXOL 350 MG/ML SOLN COMPARISON:  Chest radiograph June 14, 2020; CT angiogram chest May 09, 2020 CT abdomen and pelvis August 11 2017 FINDINGS: CTA CHEST FINDINGS Cardiovascular: There are pulmonary emboli arising from each distal main pulmonary artery with extension of pulmonary emboli into multiple lower lobe pulmonary artery branches bilaterally. Small focus of pulmonary embolus at the origin of the right upper lobe pulmonary artery noted. The right ventricle to left ventricle diameter ratio is 1.2, indicative of a degree of right heart strain. There is no appreciable thoracic aortic aneurysm or dissection. Visualized great vessels appear normal. There are occasional foci of  coronary artery calcification. There is no appreciable pericardial effusion or pericardial thickening. Mediastinum/Nodes: Visualized thyroid appears normal. There is no evident thoracic adenopathy by size criteria. There are scattered subcentimeter mediastinal lymph nodes which do not meet   size criteria for pathologic significance. No esophageal lesions are evident. Lungs/Pleura: There is airspace opacity throughout both lower lobes with more scattered opacity in the periphery of each upper lobe and right middle lobe. There is suggestion of a degree of underlying fibrotic type change. There are no appreciable pleural effusions. Note that in comparison with most recent chest CT, there is somewhat less opacity in the upper lobes bilaterally. Musculoskeletal: There are no blastic or lytic bone lesions. No chest wall lesions. Review of the MIP images confirms the above findings. CT ABDOMEN and PELVIS FINDINGS Hepatobiliary: There is hepatic steatosis. No focal liver lesions are evident. The gallbladder wall is not appreciably thickened. There is no biliary duct dilatation. Pancreas: There is no pancreatic mass or inflammatory focus. Spleen: No splenic lesions are evident. Adrenals/Urinary Tract: Adrenals bilaterally appear normal. There is a cyst in the medial mid right kidney measuring 1.3 x 1.3 cm. There is no evident hydronephrosis on either side. There is no evident renal or ureteral calculus on either side. Urinary bladder is midline with wall thickness within normal limits. Stomach/Bowel: There is no appreciable bowel wall or mesenteric thickening. Terminal ileum appears normal. No evident bowel obstruction. There is no evident free air or portal venous air. Vascular/Lymphatic: There is aortic atherosclerosis. No aneurysm evident. Major venous structures appear patent. No adenopathy appreciable in the abdomen or pelvis. Reproductive: There are occasional prostatic calculi. Prostate and seminal vesicles normal in  size and configuration. Other: Appendix appears normal. No abscess or ascites in the abdomen or pelvis. There is again noted a left inguinal hernia containing fluid but no bowel. This appearance is stable compared to the previous study. Musculoskeletal: No blastic or lytic bone lesions. No intramuscular lesions are evident. Review of the MIP images confirms the above findings. IMPRESSION: CT angiogram chest: 1. Pulmonary emboli arise from each distal main pulmonary artery with extension into multiple lower lobe pulmonary artery branches bilaterally. Pulmonary embolus also seen in the proximal aspect of the right upper lobe pulmonary artery. Positive for acute PE with CTevidence of right heart strain (RV/LV Ratio = 1.2) consistent with at least submassive (intermediate risk) PE. The presence of right heart strain has been associated with an increased risk of morbidity and mortality. 2. Multifocal pneumonia with a lower lobe predominance. There appears to be underlying fibrotic change in the periphery of the lower lobes. Somewhat less opacity in the upper lobes compared to most recent CT. Appearance consistent with COVID 19 pneumonitis with potential degree of bacterial superinfection. 3.  Foci of coronary artery calcification noted. 4.  No appreciable adenopathy. CT abdomen and pelvis: 1. No bowel wall thickening or bowel obstruction. No abscess in the abdomen or pelvis. Appendix appears normal. 2. No appreciable renal or ureteral calculus. No hydronephrosis. Urinary bladder wall thickness normal. 3. Stable small inguinal hernia on the left containing fluid but no bowel. 4. Hepatic steatosis. 5.  Aortic Atherosclerosis (ICD10-I70.0). Critical Value/emergent results were called by telephone at the time of interpretation on 06/14/2020 at 11:42 am to provider MARK QUALE , who verbally acknowledged these results. Electronically Signed   By: William  Woodruff III M.D.   On: 06/14/2020 11:43   CT ABDOMEN PELVIS W  CONTRAST  Result Date: 06/14/2020 CLINICAL DATA:  Chest and abdominal pain EXAM: CT ANGIOGRAPHY CHEST CT ABDOMEN AND PELVIS WITH CONTRAST TECHNIQUE: Multidetector CT imaging of the chest was performed using the standard protocol during bolus administration of intravenous contrast. Multiplanar CT image reconstructions and MIPs were obtained to   evaluate the vascular anatomy. Multidetector CT imaging of the abdomen and pelvis was performed using the standard protocol during bolus administration of intravenous contrast. CONTRAST:  100mL OMNIPAQUE IOHEXOL 350 MG/ML SOLN COMPARISON:  Chest radiograph June 14, 2020; CT angiogram chest May 09, 2020 CT abdomen and pelvis August 11 2017 FINDINGS: CTA CHEST FINDINGS Cardiovascular: There are pulmonary emboli arising from each distal main pulmonary artery with extension of pulmonary emboli into multiple lower lobe pulmonary artery branches bilaterally. Small focus of pulmonary embolus at the origin of the right upper lobe pulmonary artery noted. The right ventricle to left ventricle diameter ratio is 1.2, indicative of a degree of right heart strain. There is no appreciable thoracic aortic aneurysm or dissection. Visualized great vessels appear normal. There are occasional foci of coronary artery calcification. There is no appreciable pericardial effusion or pericardial thickening. Mediastinum/Nodes: Visualized thyroid appears normal. There is no evident thoracic adenopathy by size criteria. There are scattered subcentimeter mediastinal lymph nodes which do not meet size criteria for pathologic significance. No esophageal lesions are evident. Lungs/Pleura: There is airspace opacity throughout both lower lobes with more scattered opacity in the periphery of each upper lobe and right middle lobe. There is suggestion of a degree of underlying fibrotic type change. There are no appreciable pleural effusions. Note that in comparison with most recent chest CT, there is  somewhat less opacity in the upper lobes bilaterally. Musculoskeletal: There are no blastic or lytic bone lesions. No chest wall lesions. Review of the MIP images confirms the above findings. CT ABDOMEN and PELVIS FINDINGS Hepatobiliary: There is hepatic steatosis. No focal liver lesions are evident. The gallbladder wall is not appreciably thickened. There is no biliary duct dilatation. Pancreas: There is no pancreatic mass or inflammatory focus. Spleen: No splenic lesions are evident. Adrenals/Urinary Tract: Adrenals bilaterally appear normal. There is a cyst in the medial mid right kidney measuring 1.3 x 1.3 cm. There is no evident hydronephrosis on either side. There is no evident renal or ureteral calculus on either side. Urinary bladder is midline with wall thickness within normal limits. Stomach/Bowel: There is no appreciable bowel wall or mesenteric thickening. Terminal ileum appears normal. No evident bowel obstruction. There is no evident free air or portal venous air. Vascular/Lymphatic: There is aortic atherosclerosis. No aneurysm evident. Major venous structures appear patent. No adenopathy appreciable in the abdomen or pelvis. Reproductive: There are occasional prostatic calculi. Prostate and seminal vesicles normal in size and configuration. Other: Appendix appears normal. No abscess or ascites in the abdomen or pelvis. There is again noted a left inguinal hernia containing fluid but no bowel. This appearance is stable compared to the previous study. Musculoskeletal: No blastic or lytic bone lesions. No intramuscular lesions are evident. Review of the MIP images confirms the above findings. IMPRESSION: CT angiogram chest: 1. Pulmonary emboli arise from each distal main pulmonary artery with extension into multiple lower lobe pulmonary artery branches bilaterally. Pulmonary embolus also seen in the proximal aspect of the right upper lobe pulmonary artery. Positive for acute PE with CTevidence of right  heart strain (RV/LV Ratio = 1.2) consistent with at least submassive (intermediate risk) PE. The presence of right heart strain has been associated with an increased risk of morbidity and mortality. 2. Multifocal pneumonia with a lower lobe predominance. There appears to be underlying fibrotic change in the periphery of the lower lobes. Somewhat less opacity in the upper lobes compared to most recent CT. Appearance consistent with COVID 19 pneumonitis with potential degree   of bacterial superinfection. 3.  Foci of coronary artery calcification noted. 4.  No appreciable adenopathy. CT abdomen and pelvis: 1. No bowel wall thickening or bowel obstruction. No abscess in the abdomen or pelvis. Appendix appears normal. 2. No appreciable renal or ureteral calculus. No hydronephrosis. Urinary bladder wall thickness normal. 3. Stable small inguinal hernia on the left containing fluid but no bowel. 4. Hepatic steatosis. 5.  Aortic Atherosclerosis (ICD10-I70.0). Critical Value/emergent results were called by telephone at the time of interpretation on 06/14/2020 at 11:42 am to provider MARK QUALE , who verbally acknowledged these results. Electronically Signed   By: William  Woodruff III M.D.   On: 06/14/2020 11:43   US Venous Img Lower Bilateral (DVT)  Result Date: 06/14/2020 CLINICAL DATA:  Acute PE. EXAM: BILATERAL LOWER EXTREMITY VENOUS DOPPLER ULTRASOUND TECHNIQUE: Gray-scale sonography with compression, as well as color and duplex ultrasound, were performed to evaluate the deep venous system(s) from the level of the common femoral vein through the popliteal and proximal calf veins. COMPARISON:  None. FINDINGS: VENOUS Occlusive thrombus noted in the right posterior tibial vein and peroneal vein. Occlusive thrombus noted in the left popliteal vein, posterior tibial vein and peroneal vein. Normal compressibility of the common femoral, superficial femoral, and right popliteal veins. Visualized portions of profunda femoral  vein and great saphenous vein unremarkable. OTHER None. Limitations: none IMPRESSION: 1. Acute deep venous thrombosis of the right and left lower extremities, on the left from the popliteal through the posterior tibial and peroneal veins and on the right involving the posterior tibial and peroneal veins. Right popliteal vein and both common femoral and femoral veins widely patent. Electronically Signed   By: David  Ormond M.D.   On: 06/14/2020 13:55   DG Chest Port 1 View  Result Date: 06/14/2020 CLINICAL DATA:  Tachycardia with decreased oxygen saturation EXAM: PORTABLE CHEST 1 VIEW COMPARISON:  May 06, 2020 FINDINGS: There is patchy airspace opacity in the left mid lung region as well as in both lower lung regions. Heart size and pulmonary vascularity are normal. No adenopathy. No bone lesions. IMPRESSION: Patchy airspace opacity at several sites, more severe than on prior study. Appearance raises concern for multifocal atypical organism pneumonia. Check of COVID-19 status in this regard advised. No frank consolidation. Heart size normal. Electronically Signed   By: William  Woodruff III M.D.   On: 06/14/2020 08:54   Assessment/Plan The patient is a 59 year old male a with medical history significant of GERD, recent COVID-19 infection, who presents with shortness breath, left flank pain new diagnosis of submassive PE  1.  Pulmonary embolism: Patient with progressively worsening shortness of breath which prompted him to seek medical attention in our emergency department.  Patient with large submassive PE with associated right heart strain.  In the setting of hypoxia, with a large clot burden, tachycardia, and right heart strain recommend undergoing a pulmonary thrombectomy/thrombolysis in an attempt to lessen the clot burden and improve the patient's symptoms.  Procedure, risks and benefits were cemented with patient.  All questions were answered.  The patient wished to proceed.  2.  Need for  anticoagulation: Due to the patient's recent diagnosis of pulmonary embolism he will most likely be on anticoagulation for approximately 1 year. Patient will need to be followed by a pulmonologist  3.  Lateral lower extremity DVT: Asymptomatic at this time DVTs are distal. In the setting of being asymptomatic and distal there is no role for endovascular intervention at this time. We will be   happy to follow the patient's DVT in the outpatient setting  Discussed with Dr. Schnier   Raziyah Vanvleck A Braxen Dobek, PA-C  06/14/2020 4:35 PM  This note was created with Dragon medical transcription system.  Any error is purely unintentional 

## 2020-06-14 NOTE — H&P (Addendum)
History and Physical    Jack Barton YNW:[id number] DOB: [birth year] DOA: 06/14/2020  Referring MD/NP/PA:   PCP: [name], FNP   Patient coming from:  The patient is coming from home.  At baseline, pt is independent for most of ADL.        Chief Complaint: SOB, left flank pain   60 y.o. male with medical history significant of GERD, recent COVID-19 infection, who presents with shortness breath, left flank pain.  Patient was recently hospitalized due to COVID-19 infection and discharged on 1/27 from [facility].  Patient states that he developed left flank pain in left back and left rib cage since last night.  The pain is pleuritic, sharp, severe, nonradiating, aggravated by deep breath.  He has a dry cough, no fever or chills.  His shortness breath has worsened. Pt states that this morning his O2 sat was low and his heart rate was fast.  No nausea vomiting, diarrhea or abdominal pain.  No symptoms of UTI. Upon arrival to ED, patient was noted to have acute respiratory distress with hyperventilation. Patient was found to have oxygen desaturated to 85% on room air, which improved to 95% on 4 L oxygen.  ED Course: pt was found to have positive D-dimer 5565, ESR 72, negative Covid PCR, WBC 11.3, lactic acid 1.5, INR 1.01, PTT 36, troponin level 4, BNP 16, lipase 24, renal function okay, temperature 99.9, tachycardia with heart rate of 128, tachypnea with RR 38, blood pressure 128/77, 140/95.  CT abdomen/pelvis is negative for acute issues.  CT angiogram showed submassive PE with CT evidence of right heart strain and multifocal infiltration. LE showed bilateral leg DVT. Patient is admitted to progressive bed as inpatient. Dr. Mortimer Fries of ICU and Dr. Delana Meyer of vascular surgery are consulted.   CT angiogram of chest: 1. Pulmonary emboli arise from each distal main pulmonary artery with extension into multiple lower lobe pulmonary artery branches bilaterally. Pulmonary embolus also seen  in the proximal aspect of the right upper lobe pulmonary artery. Positive for acute PE with CTevidence of right heart strain (RV/LV Ratio = 1.2) consistent with at least submassive (intermediate risk) PE. The presence of right heart strain has been associated with an increased risk of morbidity and mortality.  2. Multifocal pneumonia with a lower lobe predominance. There appears to be underlying fibrotic change in the periphery of the lower lobes. Somewhat less opacity in the upper lobes compared to most recent CT. Appearance consistent with COVID 19 pneumonitis with potential degree of bacterial superinfection.  3.  Foci of coronary artery calcification noted.  4.  No appreciable adenopathy.  LE Doppler showed: 1. Acute deep venous thrombosis of the right and left lower extremities, on the left from the popliteal through the posterior tibial and peroneal veins and on the right involving the posterior tibial and peroneal veins. Right popliteal vein and both common femoral and femoral veins widely patent.  Review of Systems:   General: no fevers, chills, no body weight gain, has fatigue HEENT: no blurry vision, hearing changes or sore throat Respiratory: has dyspnea, coughing, wheezing CV: has left sided rib cage pain, no palpitations GI: no nausea, vomiting, abdominal pain, diarrhea, constipation GU: no dysuria, burning on urination, increased urinary frequency, hematuria  Ext: no leg edema Neuro: no unilateral weakness, numbness, or tingling, no vision change or hearing loss Skin: no rash, no skin tear. MSK: No muscle spasm, no deformity, no limitation of range of movement in spin. Has left  flank and back pain Heme: No easy bruising.  Travel history: No recent long distant travel.  Allergy: No Known Allergies  Past Medical History:  Diagnosis Date  . COVID-19 virus infection   . GERD (gastroesophageal reflux disease)     Past Surgical History:  Procedure Laterality  Date  . HERNIA REPAIR      Social History:  reports that he has never smoked. He has never used smokeless tobacco. He reports that he does not drink alcohol and does not use drugs.  Family History:  Family History  Problem Relation Age of Onset  . Ovarian cancer Mother      Prior to Admission medications   Medication Sig Start Date End Date Taking? Authorizing Provider  cefdinir (OMNICEF) 300 MG capsule Take 300 mg by mouth 2 (two) times daily.    [provider]  pantoprazole (PROTONIX) 40 MG tablet Take 40 mg by mouth daily.    [provider]    Physical Exam: Vitals:   06/14/20 0930 06/14/20 1000 06/14/20 1030 06/14/20 1130  BP: 135/86 (!) 141/94 137/88 (!) 140/95  Pulse: (!) 126 86 (!) 118 (!) 121  Resp: (!) 27 (!) 30 (!) 29 (!) 38  Temp:      TempSrc:      SpO2: 94% 92% 93% 93%  Weight:      Height:       General: in acute respiratory distress  HEENT:       Eyes: PERRL, EOMI, no scleral icterus.       ENT: No discharge from the ears and nose, no pharynx injection, no tonsillar enlargement.        Neck: No JVD, no bruit, no mass felt. Heme: No neck lymph node enlargement. Cardiac: S1/S2, RRR, No murmurs, No gallops or rubs. Respiratory: Has coarse breathing sound bilaterally GI: Soft, nondistended, nontender, no rebound pain, no organomegaly, BS present. GU: No hematuria Ext: No pitting leg edema bilaterally. 1+DP/PT pulse bilaterally. Musculoskeletal: No joint deformities, No joint redness or warmth, no limitation of ROM in spin. Skin: No rashes.  Neuro: Alert, oriented X3, cranial nerves II-XII grossly intact, moves all extremities normally. Psych: Patient is not psychotic, no suicidal or  hemocidal ideation.  Labs on Admission: I have personally reviewed following labs and imaging studies  CBC: Recent Labs  Lab 06/14/20 0909  WBC 11.3*  NEUTROABS 8.5*  HGB 13.8  HCT 39.9  MCV 88.9  PLT 014   Basic Metabolic Panel: Recent Labs   Lab 06/14/20 0913  NA 133*  K 4.0  CL 101  CO2 23  GLUCOSE 120*  BUN 13  CREATININE 0.72  CALCIUM 8.7*   GFR: Estimated Creatinine Clearance: 131.9 mL/min (by C-G formula based on SCr of 0.72 mg/dL). Liver Function Tests: Recent Labs  Lab 06/14/20 0913  AST 20  ALT 17  ALKPHOS 91  BILITOT 1.2  PROT 7.5  ALBUMIN 3.4*   Recent Labs  Lab 06/14/20 0913  LIPASE 24   No results for input(s): AMMONIA in the last 168 hours. Coagulation Profile: Recent Labs  Lab 06/14/20 0909  INR 1.1   Cardiac Enzymes: No results for input(s): CKTOTAL, CKMB, CKMBINDEX, TROPONINI in the last 168 hours. BNP (last 3 results) No results for input(s): PROBNP in the last 8760 hours. HbA1C: No results for input(s): HGBA1C in the last 72 hours. CBG: No results for input(s): GLUCAP in the last 168 hours. Lipid Profile: No results for input(s): CHOL, HDL, LDLCALC, TRIG, CHOLHDL, LDLDIRECT in  the last 72 hours. Thyroid Function Tests: No results for input(s): TSH, T4TOTAL, FREET4, T3FREE, THYROIDAB in the last 72 hours. Anemia Panel: No results for input(s): VITAMINB12, FOLATE, FERRITIN, TIBC, IRON, RETICCTPCT in the last 72 hours. Urine analysis:    Component Value Date/Time   COLORURINE YELLOW (A) 06/14/2020 0913   APPEARANCEUR HAZY (A) 06/14/2020 0913   LABSPEC 1.020 06/14/2020 0913   PHURINE 5.0 06/14/2020 0913   GLUCOSEU NEGATIVE 06/14/2020 0913   HGBUR NEGATIVE 06/14/2020 0913   BILIRUBINUR NEGATIVE 06/14/2020 0913   KETONESUR NEGATIVE 06/14/2020 0913   PROTEINUR NEGATIVE 06/14/2020 0913   NITRITE NEGATIVE 06/14/2020 0913   LEUKOCYTESUR NEGATIVE 06/14/2020 0913   Sepsis Labs: _0 (procalcitonin:4,lacticidven:4) ) Recent Results (from the past 240 hour(s))  Resp Panel by RT-PCR (Flu A&B, Covid) Nasopharyngeal Swab     Status: None   Collection Time: 06/14/20  9:12 AM   Specimen: Nasopharyngeal Swab; Nasopharyngeal(NP) swabs in vial transport medium  Result Value Ref  Range Status   SARS Coronavirus 2 by RT PCR NEGATIVE NEGATIVE Final    Comment: (NOTE) SARS-CoV-2 target nucleic acids are NOT DETECTED.  The SARS-CoV-2 RNA is generally detectable in upper respiratory specimens during the acute phase of infection. The lowest concentration of SARS-CoV-2 viral copies this assay can detect is 138 copies/mL. A negative result does not preclude SARS-Cov-2 infection and should not be used as the sole basis for treatment or other patient management decisions. A negative result may occur with  improper specimen collection/handling, submission of specimen other than nasopharyngeal swab, presence of viral mutation(s) within the areas targeted by this assay, and inadequate number of viral copies(<138 copies/mL). A negative result must be combined with clinical observations, patient history, and epidemiological information. The expected result is Negative.  Fact Sheet for Patients:  EntrepreneurPulse.com.au  Fact Sheet for Healthcare Providers:  IncredibleEmployment.be  This test is no t yet approved or cleared by the [place] FDA and  has been authorized for detection and/or diagnosis of SARS-CoV-2 by FDA under an Emergency Use Authorization (EUA). This EUA will remain  in effect (meaning this test can be used) for the duration of the COVID-19 declaration under Section 564(b)(1) of the Act, 21 U.S.C.section 360bbb-3(b)(1), unless the authorization is terminated  or revoked sooner.       Influenza A by PCR NEGATIVE NEGATIVE Final   Influenza B by PCR NEGATIVE NEGATIVE Final    Comment: (NOTE) The Xpert Xpress SARS-CoV-2/FLU/RSV plus assay is intended as an aid in the diagnosis of influenza from Nasopharyngeal swab specimens and should not be used as a sole basis for treatment. Nasal washings and aspirates are unacceptable for Xpert Xpress SARS-CoV-2/FLU/RSV testing.  Fact Sheet for  Patients: EntrepreneurPulse.com.au  Fact Sheet for Healthcare Providers: IncredibleEmployment.be  This test is not yet approved or cleared by the [place] FDA and has been authorized for detection and/or diagnosis of SARS-CoV-2 by FDA under an Emergency Use Authorization (EUA). This EUA will remain in effect (meaning this test can be used) for the duration of the COVID-19 declaration under Section 564(b)(1) of the Act, 21 U.S.C. section 360bbb-3(b)(1), unless the authorization is terminated or revoked.  Performed at [facility], [address] [place], [place] [address]      Radiological Exams on Admission: CT Angio Chest PE W and/or Wo Contrast  Result Date: 06/14/2020 CLINICAL DATA:  Chest and abdominal pain EXAM: CT ANGIOGRAPHY CHEST CT ABDOMEN AND PELVIS WITH CONTRAST TECHNIQUE: Multidetector CT imaging of the chest was performed using  the standard protocol during bolus administration of intravenous contrast. Multiplanar CT image reconstructions and MIPs were obtained to evaluate the vascular anatomy. Multidetector CT imaging of the abdomen and pelvis was performed using the standard protocol during bolus administration of intravenous contrast. CONTRAST:  124m OMNIPAQUE IOHEXOL 350 MG/ML SOLN COMPARISON:  Chest radiograph June 14, 2020; CT angiogram chest May 09, 2020 CT abdomen and pelvis August 11 2017 FINDINGS: CTA CHEST FINDINGS Cardiovascular: There are pulmonary emboli arising from each distal main pulmonary artery with extension of pulmonary emboli into multiple lower lobe pulmonary artery branches bilaterally. Small focus of pulmonary embolus at the origin of the right upper lobe pulmonary artery noted. The right ventricle to left ventricle diameter ratio is 1.2, indicative of a degree of right heart strain. There is no appreciable thoracic aortic aneurysm or dissection. Visualized great vessels appear normal. There are  occasional foci of coronary artery calcification. There is no appreciable pericardial effusion or pericardial thickening. Mediastinum/Nodes: Visualized thyroid appears normal. There is no evident thoracic adenopathy by size criteria. There are scattered subcentimeter mediastinal lymph nodes which do not meet size criteria for pathologic significance. No esophageal lesions are evident. Lungs/Pleura: There is airspace opacity throughout both lower lobes with more scattered opacity in the periphery of each upper lobe and right middle lobe. There is suggestion of a degree of underlying fibrotic type change. There are no appreciable pleural effusions. Note that in comparison with most recent chest CT, there is somewhat less opacity in the upper lobes bilaterally. Musculoskeletal: There are no blastic or lytic bone lesions. No chest wall lesions. Review of the MIP images confirms the above findings. CT ABDOMEN and PELVIS FINDINGS Hepatobiliary: There is hepatic steatosis. No focal liver lesions are evident. The gallbladder wall is not appreciably thickened. There is no biliary duct dilatation. Pancreas: There is no pancreatic mass or inflammatory focus. Spleen: No splenic lesions are evident. Adrenals/Urinary Tract: Adrenals bilaterally appear normal. There is a cyst in the medial mid right kidney measuring 1.3 x 1.3 cm. There is no evident hydronephrosis on either side. There is no evident renal or ureteral calculus on either side. Urinary bladder is midline with wall thickness within normal limits. Stomach/Bowel: There is no appreciable bowel wall or mesenteric thickening. Terminal ileum appears normal. No evident bowel obstruction. There is no evident free air or portal venous air. Vascular/Lymphatic: There is aortic atherosclerosis. No aneurysm evident. Major venous structures appear patent. No adenopathy appreciable in the abdomen or pelvis. Reproductive: There are occasional prostatic calculi. Prostate and seminal  vesicles normal in size and configuration. Other: Appendix appears normal. No abscess or ascites in the abdomen or pelvis. There is again noted a left inguinal hernia containing fluid but no bowel. This appearance is stable compared to the previous study. Musculoskeletal: No blastic or lytic bone lesions. No intramuscular lesions are evident. Review of the MIP images confirms the above findings. IMPRESSION: CT angiogram chest: 1. Pulmonary emboli arise from each distal main pulmonary artery with extension into multiple lower lobe pulmonary artery branches bilaterally. Pulmonary embolus also seen in the proximal aspect of the right upper lobe pulmonary artery. Positive for acute PE with CTevidence of right heart strain (RV/LV Ratio = 1.2) consistent with at least submassive (intermediate risk) PE. The presence of right heart strain has been associated with an increased risk of morbidity and mortality. 2. Multifocal pneumonia with a lower lobe predominance. There appears to be underlying fibrotic change in the periphery of the lower lobes. Somewhat less opacity  in the upper lobes compared to most recent CT. Appearance consistent with COVID 19 pneumonitis with potential degree of bacterial superinfection. 3.  Foci of coronary artery calcification noted. 4.  No appreciable adenopathy. CT abdomen and pelvis: 1. No bowel wall thickening or bowel obstruction. No abscess in the abdomen or pelvis. Appendix appears normal. 2. No appreciable renal or ureteral calculus. No hydronephrosis. Urinary bladder wall thickness normal. 3. Stable small inguinal hernia on the left containing fluid but no bowel. 4. Hepatic steatosis. 5.  Aortic Atherosclerosis (ICD10-I70.0). Critical Value/emergent results were called by telephone at the time of interpretation on 06/14/2020 at 11:42 am to provider MARK QUALE , who verbally acknowledged these results. Electronically Signed   By: Lowella Grip III M.D.   On: 06/14/2020 11:43   CT  ABDOMEN PELVIS W CONTRAST  Result Date: 06/14/2020 CLINICAL DATA:  Chest and abdominal pain EXAM: CT ANGIOGRAPHY CHEST CT ABDOMEN AND PELVIS WITH CONTRAST TECHNIQUE: Multidetector CT imaging of the chest was performed using the standard protocol during bolus administration of intravenous contrast. Multiplanar CT image reconstructions and MIPs were obtained to evaluate the vascular anatomy. Multidetector CT imaging of the abdomen and pelvis was performed using the standard protocol during bolus administration of intravenous contrast. CONTRAST:  174m OMNIPAQUE IOHEXOL 350 MG/ML SOLN COMPARISON:  Chest radiograph June 14, 2020; CT angiogram chest May 09, 2020 CT abdomen and pelvis August 11 2017 FINDINGS: CTA CHEST FINDINGS Cardiovascular: There are pulmonary emboli arising from each distal main pulmonary artery with extension of pulmonary emboli into multiple lower lobe pulmonary artery branches bilaterally. Small focus of pulmonary embolus at the origin of the right upper lobe pulmonary artery noted. The right ventricle to left ventricle diameter ratio is 1.2, indicative of a degree of right heart strain. There is no appreciable thoracic aortic aneurysm or dissection. Visualized great vessels appear normal. There are occasional foci of coronary artery calcification. There is no appreciable pericardial effusion or pericardial thickening. Mediastinum/Nodes: Visualized thyroid appears normal. There is no evident thoracic adenopathy by size criteria. There are scattered subcentimeter mediastinal lymph nodes which do not meet size criteria for pathologic significance. No esophageal lesions are evident. Lungs/Pleura: There is airspace opacity throughout both lower lobes with more scattered opacity in the periphery of each upper lobe and right middle lobe. There is suggestion of a degree of underlying fibrotic type change. There are no appreciable pleural effusions. Note that in comparison with most recent chest  CT, there is somewhat less opacity in the upper lobes bilaterally. Musculoskeletal: There are no blastic or lytic bone lesions. No chest wall lesions. Review of the MIP images confirms the above findings. CT ABDOMEN and PELVIS FINDINGS Hepatobiliary: There is hepatic steatosis. No focal liver lesions are evident. The gallbladder wall is not appreciably thickened. There is no biliary duct dilatation. Pancreas: There is no pancreatic mass or inflammatory focus. Spleen: No splenic lesions are evident. Adrenals/Urinary Tract: Adrenals bilaterally appear normal. There is a cyst in the medial mid right kidney measuring 1.3 x 1.3 cm. There is no evident hydronephrosis on either side. There is no evident renal or ureteral calculus on either side. Urinary bladder is midline with wall thickness within normal limits. Stomach/Bowel: There is no appreciable bowel wall or mesenteric thickening. Terminal ileum appears normal. No evident bowel obstruction. There is no evident free air or portal venous air. Vascular/Lymphatic: There is aortic atherosclerosis. No aneurysm evident. Major venous structures appear patent. No adenopathy appreciable in the abdomen or pelvis. Reproductive: There are  occasional prostatic calculi. Prostate and seminal vesicles normal in size and configuration. Other: Appendix appears normal. No abscess or ascites in the abdomen or pelvis. There is again noted a left inguinal hernia containing fluid but no bowel. This appearance is stable compared to the previous study. Musculoskeletal: No blastic or lytic bone lesions. No intramuscular lesions are evident. Review of the MIP images confirms the above findings. IMPRESSION: CT angiogram chest: 1. Pulmonary emboli arise from each distal main pulmonary artery with extension into multiple lower lobe pulmonary artery branches bilaterally. Pulmonary embolus also seen in the proximal aspect of the right upper lobe pulmonary artery. Positive for acute PE with  CTevidence of right heart strain (RV/LV Ratio = 1.2) consistent with at least submassive (intermediate risk) PE. The presence of right heart strain has been associated with an increased risk of morbidity and mortality. 2. Multifocal pneumonia with a lower lobe predominance. There appears to be underlying fibrotic change in the periphery of the lower lobes. Somewhat less opacity in the upper lobes compared to most recent CT. Appearance consistent with COVID 19 pneumonitis with potential degree of bacterial superinfection. 3.  Foci of coronary artery calcification noted. 4.  No appreciable adenopathy. CT abdomen and pelvis: 1. No bowel wall thickening or bowel obstruction. No abscess in the abdomen or pelvis. Appendix appears normal. 2. No appreciable renal or ureteral calculus. No hydronephrosis. Urinary bladder wall thickness normal. 3. Stable small inguinal hernia on the left containing fluid but no bowel. 4. Hepatic steatosis. 5.  Aortic Atherosclerosis (ICD10-I70.0). Critical Value/emergent results were called by telephone at the time of interpretation on 06/14/2020 at 11:42 am to provider MARK QUALE , who verbally acknowledged these results. Electronically Signed   By: Lowella Grip III M.D.   On: 06/14/2020 11:43   DG Chest Port 1 View  Result Date: 06/14/2020 CLINICAL DATA:  Tachycardia with decreased oxygen saturation EXAM: PORTABLE CHEST 1 VIEW COMPARISON:  May 06, 2020 FINDINGS: There is patchy airspace opacity in the left mid lung region as well as in both lower lung regions. Heart size and pulmonary vascularity are normal. No adenopathy. No bone lesions. IMPRESSION: Patchy airspace opacity at several sites, more severe than on prior study. Appearance raises concern for multifocal atypical organism pneumonia. Check of COVID-19 status in this regard advised. No frank consolidation. Heart size normal. Electronically Signed   By: Lowella Grip III M.D.   On: 06/14/2020 08:54     EKG: I have  personally reviewed.  Sinus rhythm, tachycardia, heart rate 130, QTC 430, nonspecific T wave change  Assessment/Plan Principal Problem:   Acute pulmonary embolism-submassive Active Problems:   GERD (gastroesophageal reflux disease)   Multifocal pneumonia   Sepsis ([place])   Acute respiratory failure with hypoxia ([facility])   Acute pulmonary embolism-submassive and DVT: Dr. Delana Meyer of vascular surgery is consulted.  Patient underwent pulmonary thrombectomy/thrombolysis bilaterally.  -will admit to progressive bed as inpatient -IV heparin -check 2d echo. -prn percocet and morphine for pain -Bronchodilators  Sepsis and Multifocal pneumonia: Patient recently had COVID-19 pneumonia.  He still has bilateral multifocal infiltration.  Patient has mild leukocytosis, temperature 99.9.  Patient meets criteria for sepsis with tachycardia with heart rate of 128, tachypnea with RR 38.  Lactic acid normal 1.5.  ID, Dr. Steva Ready is consulted. - IV Vancomycin and cefepime - Mucinex for cough  - Bronchodilators - Urine legionella and S. pneumococcal antigen - Follow up blood culture x2, sputum culture - will get Procalcitonin  - IVF: 2L of  NS bolus in ED, followed by 125 mL per hour of NS (patient has a diastolic congestive heart failure, limiting aggressive IV fluids treatment)  Acute respiratory failure with hypoxia due to combination of acute submassive PE and multifocal pneumonia -see above -Bronchodilators -Nasal cannula oxygen to maintain oxygen saturation above 93%  GERD (gastroesophageal reflux disease) -Protonix   DVT ppx: On IV Heparin      Code Status: Full code Family Communication:  Yes, patient's wife at bed side Disposition Plan:  Anticipate discharge back to previous environment Consults called:  Dr. Mortimer Fries of ICU and Dr. Delana Meyer of vascular surgery are consulted. Admission status and Level of care: Progressive Cardiac:  as inpt      SDU/inpation         Status is:  Inpatient  Remains inpatient appropriate because:Inpatient level of care appropriate due to severity of illness   Dispo: The patient is from: Home              Anticipated d/c is to: Home              Anticipated d/c date is: 2 days              Patient currently is not medically stable to d/c.   Difficult to place patient No           Date of Service 06/14/2020    [facility]   If 7PM-7AM, please contact night-coverage [email or web address] 06/14/2020, 1:02 PM

## 2020-06-14 NOTE — ED Triage Notes (Signed)
Pt to ED via POV, pt states that last night he noticed pain in the left back and left rib area. Pt states that this morning his O2 sat was low and his heart rate was elevated. Upon arrival to ED, pt hyperventilating, pt was instructed to take slow breaths, in through his nose and out through his mouth. Pt reports that taking a deep breath is very painful. Pt reports that he was hospitalized recently for COVID. Pt DC'ed 1/27 from [facility]. Pt initial SpO2 85%, pt placed on 2 liters via [place] and SpO2 improved to 94%

## 2020-06-14 NOTE — Op Note (Signed)
[facility]  Percutaneous Study/Intervention Procedural Note   Date of Surgery: 06/14/2020,4:56 PM  Surgeon:Allea Kassner, Latina Craver   Pre-operative Diagnosis: Pulmonary emboli with right heart strain and hypoxia  Post-operative diagnosis:  Same  Procedure(s) Performed:  1.  Selective injection right subsegmental pulmonary arteries  2.  Selective injection left subsegmental pulmonary artery  3.  Infusion of TPA for thrombolysis  4.  Mechanical thrombectomy pulmonary emboli using the penumbra CAT 8 lightening device    Anesthesia: Conscious sedation was administered under my direct supervision by the interventional radiology RN. IV Versed plus fentanyl were utilized. Continuous ECG, pulse oximetry and blood pressure was monitored throughout the entire procedure.  Conscious sedation was administered for a total of 30 minutes and 4 seconds.  Sheath: 8 [place] right common femoral vein antegrade  Contrast: 30 cc   Fluoroscopy Time: 6.8 minutes  Indications:  Patient presented to the hospital with hypoxia and shortness of breath. The patient is unable to get out of bed and transfer to a chair without increased dyspnea. The patient's O2 saturations off nasal cannula are in the 80%. CT angiogram demonstrated bilateral pulmonary emboli associated with significant right heart strain. Given the long-term sequela and the patient's symptomatic condition the risks and benefits for angiography with thrombectomy are reviewed all questions are answered, the patient agrees to proceed.  Procedure:  JOSEDE CICERO a 60 y.o. male who was identified and appropriate procedural time out was performed.  The patient was then placed supine on the table and prepped and draped in the usual sterile fashion.  Ultrasound was used to evaluate the right common femoral vein.  It was compressible indicating it is patent .  A digital ultrasound image was acquired for the permanent record.  A  micropuncture needle was used to access the right common femoral vein under direct ultrasound guidance.  A microwire was then advanced under fluoroscopic guidance followed by micro-sheath.  A 0.035 J wire was advanced without resistance and a 5Fr sheath was placed.    The J-wire pigtail catheter was advanced up to the right atrium where a bolus injection contrast was used to demonstrate the pulmonary artery outflow. Floppy Glidewire was then exchanged for the J-wire and the pigtail catheter was exchanged for an JR4. Using the commendation of the floppy Glidewire and the angled pigtail catheter the pulmonary outflow track was negotiated.  With the catheter in the right side bolus injection contrast was utilized to demonstrate the right pulmonary artery vasculature. This demonstrated multiple PE in the right middle and right lower lobe and the subsegmental branches.  The JB for catheter was then advanced out so that it was positioned within the thrombus and 8 milligrams of TPA were infused directly into the clot.  While the TPA was dwelling for approximately 30 minutes the catheter was then advanced into the opposite side and the JB for catheter was used to image the left lower lobe pulmonary artery and its subsegmental branches.  Again thrombus was noted.  2 mg of TPA was instilled directly into this pulmonary artery..  After an appropriate dwell time the advantage wires reintroduced through the JR4 catheter and the Penumbra Cat 8 extra torque catheter was then advanced into the thrombus in the right lower lobe multiple passes were made and then the catheter was negotiated into the middle lobe.  Follow-up imaging was then performed through the catheter itself small amount of residual thrombus was noted and this was addressed with multiple further  passes of the CAT 8.  Follow-up imaging demonstrated near total resolution of the right-sided thrombus and I elected to negotiate the catheter into the left lower  lobe.  Where multiple passes were made in the left lower lobe.  Follow-up imaging demonstrated resolution of the left-sided thrombus I elected to terminate the procedure.  The sheath was pulled and pressure was held there were no immediate complications.  Findings:   Right pulmonary artery: There is a large burden of thrombus located in the distal right main pulmonary artery extending into the right middle and right lower lobe pulmonary arteries and their subsegmental branches.  After negotiating the penumbra catheter into the right lower and middle lobe arteries and their subsegmental branches there is now near total resolution of the thrombus.  Left pulmonary: There is thrombus noted in the left lower lobe pulmonary artery.  Again after negotiating the penumbra catheter into the lower lobe pulmonary artery multiple passes were made follow-up imaging demonstrates near total resolution of the thrombus.    Disposition: Patient was taken to the recovery room in stable condition having tolerated the procedure well.  Earl Lites Elishah Ashmore 06/14/2020,4:56 PM

## 2020-06-14 NOTE — Progress Notes (Signed)
*  PRELIMINARY RESULTS* Echocardiogram 2D Echocardiogram has been performed.  Cristela Blue 06/14/2020, 2:26 PM

## 2020-06-14 NOTE — Progress Notes (Signed)
Called and spoke with Dr. Clyde Lundborg re: pt. More SOB, c/o constant chest pain  And dry mouth. New orders received. Changing pt. Bed back to progressive care from med. Surg. Per MD orders.

## 2020-06-14 NOTE — ED Notes (Signed)
Report given to surgery RN

## 2020-06-14 NOTE — Interval H&P Note (Signed)
History and Physical Interval Note:  06/14/2020 5:26 PM  Jack Barton  has presented today for surgery, with the diagnosis of PE.  The various methods of treatment have been discussed with the patient and family. After consideration of risks, benefits and other options for treatment, the patient has consented to  Procedure(s): PULMONARY THROMBECTOMY / THROMBOLYSIS (Bilateral) as a surgical intervention.  The patient's history has been reviewed, patient examined, no change in status, stable for surgery.  I have reviewed the patient's chart and labs.  Questions were answered to the patient's satisfaction.     [name]

## 2020-06-14 NOTE — Progress Notes (Signed)
PHARMACY -  BRIEF ANTIBIOTIC NOTE   Pharmacy has received consult(s) for vancomycin and cefepime from an ED provider.  The patient's profile has been reviewed for ht/wt/allergies/indication/available labs.    One time order(s) placed for vancomycin 1 g + cefepime 2 g  Further antibiotics/pharmacy consults should be ordered by admitting physician if indicated.                       Thank you,  Pricilla Riffle, PharmD 06/14/2020  9:26 AM

## 2020-06-14 NOTE — ED Notes (Signed)
Patient to CT.

## 2020-06-14 NOTE — ED Provider Notes (Signed)
[facility] Emergency Department Provider Note   ____________________________________________   Event Date/Time   First MD Initiated Contact with Patient 06/14/20 [phone]     (approximate)  I have reviewed the triage vital signs and the nursing notes.   HISTORY  Chief Complaint Shortness of Breath    HPI Jack Barton is a 60 y.o. male reports no previous medical history until he was diagnosed with COVID on January 3.  At which point he was spent several days at [facility] but all, was treated for Covid, and then was transferred to [facility] where he did rehabilitation.  He reports he has had an ongoing cough shortness of breath weakness.  Over the last couple days though he has developed a severe and worsening pain in his left lower rib cage left upper abdomen.  Hurts to breathe.  He is having trouble breathing and shortness of breath dropping his oxygen levels into the 70s and heart rate as high as 140 today  Reports a fairly severe pain in his left lower chest left upper abdomen with breathing.  No ongoing fevers.  Does have a cough of slight whitish sputum for the last several days.  Currently just completing a course of antibiotic that was given to him by his primary provider   Past Medical History:  Diagnosis Date  . COVID-19 virus infection   . GERD (gastroesophageal reflux disease)     Patient Active Problem List   Diagnosis Date Noted  . Multifocal pneumonia 06/14/2020  . Sepsis (HCC) 06/14/2020  . Acute respiratory failure with hypoxia (HCC) 06/14/2020  . Acute pulmonary embolism-submassive 06/14/2020  . GERD (gastroesophageal reflux disease)     Past Surgical History:  Procedure Laterality Date  . HERNIA REPAIR      Prior to Admission medications   Medication Sig Start Date End Date Taking? Authorizing Provider  cefdinir (OMNICEF) 300 MG capsule Take 300 mg by mouth 2 (two) times daily.    [provider]  pantoprazole  (PROTONIX) 40 MG tablet Take 40 mg by mouth daily.    [provider]    Allergies Patient has no known allergies.  No family history on file.  Social History Social History   Tobacco Use  . Smoking status: Never Smoker  . Smokeless tobacco: Never Used  Substance Use Topics  . Alcohol use: Never  . Drug use: Never    Review of Systems Constitutional: No fever/chills Eyes: No visual changes. ENT: No sore throat. Cardiovascular: See HPI Respiratory: Short of breath denies wheezing gastrointestinal: No abdominal pain.   Genitourinary: Negative for dysuria. Musculoskeletal: Negative for back pain. Skin: Negative for rash. Neurological: Negative for headaches, areas of focal weakness or numbness.    ____________________________________________   PHYSICAL EXAM:  VITAL SIGNS: ED Triage Vitals  Enc Vitals Group     BP 06/14/20 0836 (!) 139/96     Pulse Rate 06/14/20 0836 (!) 128     Resp 06/14/20 0836 (!) 32     Temp 06/14/20 0836 99.5 F (37.5 C)     Temp Source 06/14/20 0836 Oral     SpO2 06/14/20 0836 95 %     Weight 06/14/20 0837 230 lb (104.3 kg)     Height 06/14/20 0837 6\' 4"  (1.93 m)     Head Circumference --      Peak Flow --      Pain Score 06/14/20 0836 8     Pain Loc --  Pain Edu? --      Excl. in GC? --     Constitutional: Alert and oriented.  Ill-appearing, tachypneic, splinting due to what he describes as a very pleuritic pain over his left chest wall Eyes: Conjunctivae are normal. Head: Atraumatic. Nose: No congestion/rhinnorhea. Mouth/Throat: Mucous membranes are moist. Neck: No stridor.  Cardiovascular: Tachycardic rate, regular rhythm. Grossly normal heart sounds.  Good peripheral circulation. Respiratory: Moderate tachypnea, moderate increased work of breathing.  No wheezing.  Diminished lung sounds left base slight crackles noted in the bases bilaterally as well.  Gastrointestinal: Soft and nontender. No  distention. Musculoskeletal: No lower extremity tenderness nor edema. Neurologic:  Normal speech and language. No gross focal neurologic deficits are appreciated.  Skin:  Skin is warm, dry and intact. No rash noted. Psychiatric: Mood and affect are normal. Speech and behavior are normal.  ____________________________________________   LABS (all labs ordered are listed, but only abnormal results are displayed)  Labs Reviewed  CBC WITH DIFFERENTIAL/PLATELET - Abnormal; Notable for the following components:      Result Value   WBC 11.3 (*)    Neutro Abs 8.5 (*)    All other components within normal limits  SEDIMENTATION RATE - Abnormal; Notable for the following components:   Sed Rate 72 (*)    All other components within normal limits  FIBRIN DERIVATIVES D-DIMER ([facility] ONLY) - Abnormal; Notable for the following components:   Fibrin derivatives D-dimer ([facility]) 5,565.99 (*)    All other components within normal limits  URINALYSIS, COMPLETE (UACMP) WITH MICROSCOPIC - Abnormal; Notable for the following components:   Color, Urine YELLOW (*)    APPearance HAZY (*)    All other components within normal limits  COMPREHENSIVE METABOLIC PANEL - Abnormal; Notable for the following components:   Sodium 133 (*)    Glucose, Bld 120 (*)    Calcium 8.7 (*)    Albumin 3.4 (*)    All other components within normal limits  RESP PANEL BY RT-PCR (FLU A&B, COVID) ARPGX2  CULTURE, BLOOD (ROUTINE X 2)  CULTURE, BLOOD (ROUTINE X 2)  BRAIN NATRIURETIC PEPTIDE  LACTIC ACID, PLASMA  LIPASE, BLOOD  PROCALCITONIN  PROTIME-INR  APTT  LACTIC ACID, PLASMA  PROTIME-INR  APTT  HEPARIN LEVEL (UNFRACTIONATED)  TROPONIN I (HIGH SENSITIVITY)   ____________________________________________  EKG  Reviewed interpreted by me at 840 Heart rate 130 QRS 80 QTc 430 Sinus tachycardia, mild nonspecific T wave abnormalities. ____________________________________________  RADIOLOGY  CT Angio Chest PE W and/or  Wo Contrast  Result Date: 06/14/2020 CLINICAL DATA:  Chest and abdominal pain EXAM: CT ANGIOGRAPHY CHEST CT ABDOMEN AND PELVIS WITH CONTRAST TECHNIQUE: Multidetector CT imaging of the chest was performed using the standard protocol during bolus administration of intravenous contrast. Multiplanar CT image reconstructions and MIPs were obtained to evaluate the vascular anatomy. Multidetector CT imaging of the abdomen and pelvis was performed using the standard protocol during bolus administration of intravenous contrast. CONTRAST:  OMNIPAQUE IOHEXOL 350 MG/ML SOLN COMPARISON:  Chest radiograph June 14, 2020; CT angiogram chest May 09, 2020 CT abdomen and pelvis August 11 2017 FINDINGS: CTA CHEST FINDINGS Cardiovascular: There are pulmonary emboli arising from each distal main pulmonary artery with extension of pulmonary emboli into multiple lower lobe pulmonary artery branches bilaterally. Small focus of pulmonary embolus at the origin of the right upper lobe pulmonary artery noted. The right ventricle to left ventricle diameter ratio is 1.2, indicative of a degree of right heart strain. There is no appreciable  thoracic aortic aneurysm or dissection. Visualized great vessels appear normal. There are occasional foci of coronary artery calcification. There is no appreciable pericardial effusion or pericardial thickening. Mediastinum/Nodes: Visualized thyroid appears normal. There is no evident thoracic adenopathy by size criteria. There are scattered subcentimeter mediastinal lymph nodes which do not meet size criteria for pathologic significance. No esophageal lesions are evident. Lungs/Pleura: There is airspace opacity throughout both lower lobes with more scattered opacity in the periphery of each upper lobe and right middle lobe. There is suggestion of a degree of underlying fibrotic type change. There are no appreciable pleural effusions. Note that in comparison with most recent chest CT, there is  somewhat less opacity in the upper lobes bilaterally. Musculoskeletal: There are no blastic or lytic bone lesions. No chest wall lesions. Review of the MIP images confirms the above findings. CT ABDOMEN and PELVIS FINDINGS Hepatobiliary: There is hepatic steatosis. No focal liver lesions are evident. The gallbladder wall is not appreciably thickened. There is no biliary duct dilatation. Pancreas: There is no pancreatic mass or inflammatory focus. Spleen: No splenic lesions are evident. Adrenals/Urinary Tract: Adrenals bilaterally appear normal. There is a cyst in the medial mid right kidney measuring 1.3 x 1.3 cm. There is no evident hydronephrosis on either side. There is no evident renal or ureteral calculus on either side. Urinary bladder is midline with wall thickness within normal limits. Stomach/Bowel: There is no appreciable bowel wall or mesenteric thickening. Terminal ileum appears normal. No evident bowel obstruction. There is no evident free air or portal venous air. Vascular/Lymphatic: There is aortic atherosclerosis. No aneurysm evident. Major venous structures appear patent. No adenopathy appreciable in the abdomen or pelvis. Reproductive: There are occasional prostatic calculi. Prostate and seminal vesicles normal in size and configuration. Other: Appendix appears normal. No abscess or ascites in the abdomen or pelvis. There is again noted a left inguinal hernia containing fluid but no bowel. This appearance is stable compared to the previous study. Musculoskeletal: No blastic or lytic bone lesions. No intramuscular lesions are evident. Review of the MIP images confirms the above findings. IMPRESSION: CT angiogram chest: 1. Pulmonary emboli arise from each distal main pulmonary artery with extension into multiple lower lobe pulmonary artery branches bilaterally. Pulmonary embolus also seen in the proximal aspect of the right upper lobe pulmonary artery. Positive for acute PE with CTevidence of right  heart strain (RV/LV Ratio = 1.2) consistent with at least submassive (intermediate risk) PE. The presence of right heart strain has been associated with an increased risk of morbidity and mortality. 2. Multifocal pneumonia with a lower lobe predominance. There appears to be underlying fibrotic change in the periphery of the lower lobes. Somewhat less opacity in the upper lobes compared to most recent CT. Appearance consistent with COVID 19 pneumonitis with potential degree of bacterial superinfection. 3.  Foci of coronary artery calcification noted. 4.  No appreciable adenopathy. CT abdomen and pelvis: 1. No bowel wall thickening or bowel obstruction. No abscess in the abdomen or pelvis. Appendix appears normal. 2. No appreciable renal or ureteral calculus. No hydronephrosis. Urinary bladder wall thickness normal. 3. Stable small inguinal hernia on the left containing fluid but no bowel. 4. Hepatic steatosis. 5.  Aortic Atherosclerosis (ICD10-I70.0). Critical Value/emergent results were called by telephone at the time of interpretation on 06/14/2020 at 11:42 am to provider MARK QUALE , who verbally acknowledged these results. Electronically Signed   By: Bretta BangWilliam  Woodruff III M.D.   On: 06/14/2020 11:43   CT ABDOMEN  PELVIS W CONTRAST  Result Date: 06/14/2020 CLINICAL DATA:  Chest and abdominal pain EXAM: CT ANGIOGRAPHY CHEST CT ABDOMEN AND PELVIS WITH CONTRAST TECHNIQUE: Multidetector CT imaging of the chest was performed using the standard protocol during bolus administration of intravenous contrast. Multiplanar CT image reconstructions and MIPs were obtained to evaluate the vascular anatomy. Multidetector CT imaging of the abdomen and pelvis was performed using the standard protocol during bolus administration of intravenous contrast. CONTRAST:  OMNIPAQUE IOHEXOL 350 MG/ML SOLN COMPARISON:  Chest radiograph June 14, 2020; CT angiogram chest May 09, 2020 CT abdomen and pelvis August 11 2017 FINDINGS:  CTA CHEST FINDINGS Cardiovascular: There are pulmonary emboli arising from each distal main pulmonary artery with extension of pulmonary emboli into multiple lower lobe pulmonary artery branches bilaterally. Small focus of pulmonary embolus at the origin of the right upper lobe pulmonary artery noted. The right ventricle to left ventricle diameter ratio is 1.2, indicative of a degree of right heart strain. There is no appreciable thoracic aortic aneurysm or dissection. Visualized great vessels appear normal. There are occasional foci of coronary artery calcification. There is no appreciable pericardial effusion or pericardial thickening. Mediastinum/Nodes: Visualized thyroid appears normal. There is no evident thoracic adenopathy by size criteria. There are scattered subcentimeter mediastinal lymph nodes which do not meet size criteria for pathologic significance. No esophageal lesions are evident. Lungs/Pleura: There is airspace opacity throughout both lower lobes with more scattered opacity in the periphery of each upper lobe and right middle lobe. There is suggestion of a degree of underlying fibrotic type change. There are no appreciable pleural effusions. Note that in comparison with most recent chest CT, there is somewhat less opacity in the upper lobes bilaterally. Musculoskeletal: There are no blastic or lytic bone lesions. No chest wall lesions. Review of the MIP images confirms the above findings. CT ABDOMEN and PELVIS FINDINGS Hepatobiliary: There is hepatic steatosis. No focal liver lesions are evident. The gallbladder wall is not appreciably thickened. There is no biliary duct dilatation. Pancreas: There is no pancreatic mass or inflammatory focus. Spleen: No splenic lesions are evident. Adrenals/Urinary Tract: Adrenals bilaterally appear normal. There is a cyst in the medial mid right kidney measuring 1.3 x 1.3 cm. There is no evident hydronephrosis on either side. There is no evident renal or ureteral  calculus on either side. Urinary bladder is midline with wall thickness within normal limits. Stomach/Bowel: There is no appreciable bowel wall or mesenteric thickening. Terminal ileum appears normal. No evident bowel obstruction. There is no evident free air or portal venous air. Vascular/Lymphatic: There is aortic atherosclerosis. No aneurysm evident. Major venous structures appear patent. No adenopathy appreciable in the abdomen or pelvis. Reproductive: There are occasional prostatic calculi. Prostate and seminal vesicles normal in size and configuration. Other: Appendix appears normal. No abscess or ascites in the abdomen or pelvis. There is again noted a left inguinal hernia containing fluid but no bowel. This appearance is stable compared to the previous study. Musculoskeletal: No blastic or lytic bone lesions. No intramuscular lesions are evident. Review of the MIP images confirms the above findings. IMPRESSION: CT angiogram chest: 1. Pulmonary emboli arise from each distal main pulmonary artery with extension into multiple lower lobe pulmonary artery branches bilaterally. Pulmonary embolus also seen in the proximal aspect of the right upper lobe pulmonary artery. Positive for acute PE with CTevidence of right heart strain (RV/LV Ratio = 1.2) consistent with at least submassive (intermediate risk) PE. The presence of right heart strain has been  associated with an increased risk of morbidity and mortality. 2. Multifocal pneumonia with a lower lobe predominance. There appears to be underlying fibrotic change in the periphery of the lower lobes. Somewhat less opacity in the upper lobes compared to most recent CT. Appearance consistent with COVID 19 pneumonitis with potential degree of bacterial superinfection. 3.  Foci of coronary artery calcification noted. 4.  No appreciable adenopathy. CT abdomen and pelvis: 1. No bowel wall thickening or bowel obstruction. No abscess in the abdomen or pelvis. Appendix  appears normal. 2. No appreciable renal or ureteral calculus. No hydronephrosis. Urinary bladder wall thickness normal. 3. Stable small inguinal hernia on the left containing fluid but no bowel. 4. Hepatic steatosis. 5.  Aortic Atherosclerosis (ICD10-I70.0). Critical Value/emergent results were called by telephone at the time of interpretation on 06/14/2020 at 11:42 am to provider MARK QUALE , who verbally acknowledged these results. Electronically Signed   By: Bretta Bang III M.D.   On: 06/14/2020 11:43   DG Chest Port 1 View  Result Date: 06/14/2020 CLINICAL DATA:  Tachycardia with decreased oxygen saturation EXAM: PORTABLE CHEST 1 VIEW COMPARISON:  May 06, 2020 FINDINGS: There is patchy airspace opacity in the left mid lung region as well as in both lower lung regions. Heart size and pulmonary vascularity are normal. No adenopathy. No bone lesions. IMPRESSION: Patchy airspace opacity at several sites, more severe than on prior study. Appearance raises concern for multifocal atypical organism pneumonia. Check of COVID-19 status in this regard advised. No frank consolidation. Heart size normal. Electronically Signed   By: Bretta Bang III M.D.   On: 06/14/2020 08:54     Imaging reviewed personally discussed with Dr. Margarita Grizzle the CT imaging ____________________________________________   PROCEDURES  Procedure(s) performed: None  Procedures  Critical Care performed: Yes, see critical care note(s)  CRITICAL CARE Performed by: Sharyn Creamer   Total critical care time: 40 minutes  Critical care time was exclusive of separately billable procedures and treating other patients.  Critical care was necessary to treat or prevent imminent or life-threatening deterioration.  Critical care was time spent personally by me on the following activities: development of treatment plan with patient and/or surrogate as well as nursing, discussions with consultants, evaluation of patient's response  to treatment, examination of patient, obtaining history from patient or surrogate, ordering and performing treatments and interventions, ordering and review of laboratory studies, ordering and review of radiographic studies, pulse oximetry and re-evaluation of patient's condition.  ____________________________________________   INITIAL IMPRESSION / ASSESSMENT AND PLAN / ED COURSE  Pertinent labs & imaging results that were available during my care of the patient were reviewed by me and considered in my medical decision making (see chart for details).   Recent COVID, now presenting with severe left-sided pleuritic pain and hypoxia.  Tachycardia hypoxia, patient with concern for acute hypoxic respiratory failure in the setting of Covid and possible HCAP.  Empirically cover for HCAP given chest x-ray imaging but await CT imaging to exclude further etiologies especially pulmonary embolism as he appears to be in a high risk nature  I doubt acute ACS, he does not have LFT elevation on EKG and his symptoms seem very pleuritic and atypical of ACS    Clinical Course as of 06/14/20 1214  Fri Jun 14, 2020  0903 Consult placed with infectious disease Dr. Joylene Draft [MQ]  [phone] Patient alert, feeling improved pain relieved now after second dose of Dilaudid.  He appears much more comfortable.  Currently awaiting CT scan [  MQ]  1151 Have paged Dr. Nickolas Madrid for consultation for submassive PEs [MQ]    Clinical Course User Index [MQ] Sharyn Creamer, MD   ----------------------------------------- 12:16 PM on 06/14/2020 -----------------------------------------  Pulmonary consult done by Dr. Belia Heman, he will see the patient recommends admission to progressive care unit and also coconsultation has been placed with vascular surgery Dr. Gilda Crease.  Heparin and fluids at this point recommended by pulmonary, will continue these.  Patient understanding agreeable plan for admission.  After receiving additional  hydromorphone his pain is controlled he is awake alert wife at the bedside.  ____________________________________________   FINAL CLINICAL IMPRESSION(S) / ED DIAGNOSES  Final diagnoses:  Acute hypoxemic respiratory failure (HCC)  Long COVID  HCAP (healthcare-associated pneumonia)  Acute pulmonary embolism, unspecified pulmonary embolism type, unspecified whether acute cor pulmonale present [facility])        Note:  This document was prepared using Dragon voice recognition software and may include unintentional dictation errors       Sharyn Creamer, MD 06/14/20 1216

## 2020-06-14 NOTE — Progress Notes (Signed)
CODE SEPSIS - PHARMACY COMMUNICATION  **Broad Spectrum Antibiotics should be administered within 1 hour of Sepsis diagnosis**  Time Code Sepsis Called/Page Received: 0211  Antibiotics Ordered: vancomycin/cefepime  Time of 1st antibiotic administration: 0924  Additional action taken by pharmacy: NA    Pricilla Riffle ,PharmD Clinical Pharmacist  06/14/2020  9:27 AM

## 2020-06-14 NOTE — Progress Notes (Signed)
Pt. With RR still in the 30's, ST, but not in resp. Distress. States "the O2 feels a little better."(humidified O2). Pt. tx now to rm. 244; heparin gtt. Started at 1800 U/hr. And verified with receiving RN. Dr. Rhona Leavens made aware. Dr. Dorris Fetch notified and stated "someone will see pt. Upon tx to floor."

## 2020-06-14 NOTE — Consult Note (Signed)
NAME:  Jack Barton, MRN:  [id number], DOB:  Jul 21, 1960, LOS: 0 ADMISSION DATE:  06/14/2020, CONSULTATION DATE:  06/14/2020 REFERRING MD:  Dr. Clyde Lundborg, CHIEF COMPLAINT: Shortness of breath  Brief History:  60 year old male admitted with bilateral pulmonary emboli s/p thrombectomy & thrombolysis, with continued shortness of breath PCCM consulted.  History of Present Illness:  60 year old male with a recent hospitalization for COVID-19 diagnosis on May 06, 2020 at [facility], discharged on May 30, 2020.  He presented to the ED from home with left flank/back/rib cage pain, worse with inspiration since 06/13/2020 and worsening shortness of breath.  He reported to the ED physician his oxygen levels dropped into the 70s and his heart rate was tachycardic in the 140s before arrival at [facility].  ED course: Repeat COVID-19 was negative, continued severe left-sided pleuritic pain and hypoxia with tachycardia.  CTa demonstrated submassive pulmonary emboli with evidence of right heart strain & multifocal pneumonia.  Initial vitals: T 99.5, tachypneic with RR 32, tachycardic HR 128, BP 139/96, SPO2 95% on 2 L nasal cannula. TRH was consulted for admission with vascular surgery consulted for thrombectomy/thrombolysis. Post procedure patient remained tachypneic and hypoxic requiring 6 L HFNC.  Patient was upgraded to progressive care status and PCCM was consulted for further management and monitoring.  Past Medical History:  GERD COVID-19 (05/06/20)  Significant Hospital Events:  06/14/20- Admitted to PCU s/p thrombectomy & thrombolysis  Consults:  ID Vascular PCCM  Procedures:  06/14/20 Thrombectomy & Thrombolysis  Significant Diagnostic Tests:  06/14/20 CTa Chest & abdomen >> Pulmonary emboli arise from each distal main pulmonary artery with extension into multiple lower lobe pulmonary artery branches bilaterally. Pulmonary embolus also seen in the proximal aspect of the right upper lobe  pulmonary artery. Positive for acute PE with CTevidence of right heart strain (RV/LV Ratio = 1.2) consistent with at least submassive (intermediate risk) PE.  Multifocal pneumonia with a lower lobe predominance. hepatic Steatosis, stable small inguinal hernia on left containing fluid but no bowel. 06/14/20 US venous BLE >> Acute deep venous thrombosis of the right and left lower extremities, on the left from the popliteal through the posterior tibial and peroneal veins and on the right involving the posterior tibial and peroneal veins. Right popliteal vein and both common femoral and femoral veins widely patent. 06/14/20 echocardiogram >> Micro Data:  06/14/20 COVID-19 >> negative 06/14/20- MRSA PCR >> 06/14/20 Ur Legionella >> 06/14/20 S pneumoniacoccal antigen >> Antimicrobials:   06/14/20 Cefepime >> 06/14/20 Vancomycin >>  Interim History / Subjective:  Patient tachycardic c/o of chest discomfort post thrombectomy/thrombolysis procedure. Patient responsive but dyspneic when talking on 6 L [place]. Patient recently given morphine by nursing staff. Fine crackles auscultated in bilateral bases, lasix & cardiac enzymes ordered.  Labs/ Imaging personally reviewed Net: +977 mL (but MAR documents the patient receiving 1 L total IVF bolus & continuous IVF totaling an additional liter + heparin drip) Na+/ K+: 133/ 4.0 BUN/Cr.: 13/0.72 Hgb: 13.8  WBC/ TMAX: 11.3/ 37.7 Lactic/ PCT: 1.5/ <0.10 BNP: 16.1 Troponin: 4 Fibrin derv.: 5565.99  CXR 06/14/2020: progressing patchy airspace opacity at several sites, concerning for atypical pneumonia. No consolidation. Objective   Blood pressure (!) 135/94, pulse (!) 131, temperature 98.8 F (37.1 C), temperature source Oral, resp. rate 18, height 6\' 4"  (1.93 m), weight 104.3 kg, SpO2 92 %.        Intake/Output Summary (Last 24 hours) at 06/14/2020 1918 Last data filed at 06/14/2020 1348 Gross per 24 hour  Intake  976.95 ml  Output -  Net 976.95 ml   Filed  Weights   06/14/20 0837 06/14/20 1538  Weight: 104.3 kg 104.3 kg    Examination: General: Adult male, ill-appearing, lying in bed responsive  HEENT: MM pink/moist, anicteric, atraumatic, neck supple Neuro: *A&O x 4, able to follow commands, PERRL +3 , MAE CV: s1s2 RRR, ST on monitor, no r/m/g Pulm: Regular, slightly labored & dyspneic on 6 L [place], breath sounds clear-BUL & crackles-BLL GI: soft, rounded, non tender, bs x 4 Skin: no rashes/lesions noted Extremities: warm/dry, pulses + 2 R/P, no edema noted  Resolved Hospital Problem list     Assessment & Plan:  Acute Hypoxic Respiratory failure Multifocal pneumonia Fine crackles auscultated in the bilateral lung bases, patient received 1 L total IVF boluses & continuous IVF totaling a 2nd L of IVF. Patient also complaining of chest discomfort post recent intervention.  Left-sided pain is reproducible and appears to be pleuritic in nature. -Supplemental oxygen to maintain SPO2 > 90% -Continue bronchodilators as needed - Intermittent chest x-ray & ABG PRN - Ensure adequate pulmonary hygiene  - F/u cultures, trend PCT - Lasix 40 mg ordered - continue morphine PRN  - STAT troponin - Xanax as needed 0.5 mg PRN added for anxiety - Strict I/O's: alert provider if UOP < 0.5 mL/kg/hr - Daily BMP, replace electrolytes PRN -Continue vancomycin & cefepime for pneumonia -Continue Mucinex for cough -Urine Legionella and S pneumoniacoccal antigen pending  Bilateral Pulmonary Emboli s/p thrombectomy & thrombolysis Bilateral Lower extremity DVT's - continue heparin drip - vascular surgery following - Toradol as needed added to help with pleuritic pain  Best practice (evaluated daily)  Diet: NPO Pain/Anxiety/Delirium protocol (if indicated): morphine PRN VAP protocol (if indicated): N/A DVT prophylaxis: heparin drip GI prophylaxis: protonix Glucose control: monitor daily Mobility: mobilize as tolerated Disposition:PCU  Goals of Care:   Last date of multidisciplinary goals of care discussion:06/14/20 Family and staff present: MD, APP, patient & spouse Summary of discussion: current plan of care including heparin drip, lasix & troponin assessment Follow up goals of care discussion due: 06/15/20 Code Status: FULL  Labs   CBC: Recent Labs  Lab 06/14/20 0909  WBC 11.3*  NEUTROABS 8.5*  HGB 13.8  HCT 39.9  MCV 88.9  PLT 255    Basic Metabolic Panel: Recent Labs  Lab 06/14/20 0913  NA 133*  K 4.0  CL 101  CO2 23  GLUCOSE 120*  BUN 13  CREATININE 0.72  CALCIUM 8.7*   GFR: Estimated Creatinine Clearance: 131.9 mL/min (by C-G formula based on SCr of 0.72 mg/dL). Recent Labs  Lab 06/14/20 0909 06/14/20 0913  PROCALCITON  --  <0.10  WBC 11.3*  --   LATICACIDVEN 1.5  --     Liver Function Tests: Recent Labs  Lab 06/14/20 0913  AST 20  ALT 17  ALKPHOS 91  BILITOT 1.2  PROT 7.5  ALBUMIN 3.4*   Recent Labs  Lab 06/14/20 0913  LIPASE 24   No results for input(s): AMMONIA in the last 168 hours.  ABG No results found for: PHART, PCO2ART, PO2ART, HCO3, TCO2, ACIDBASEDEF, O2SAT   Coagulation Profile: Recent Labs  Lab 06/14/20 0909  INR 1.1    Cardiac Enzymes: No results for input(s): CKTOTAL, CKMB, CKMBINDEX, TROPONINI in the last 168 hours.  HbA1C: No results found for: HGBA1C  CBG: No results for input(s): GLUCAP in the last 168 hours.  Review of Systems: Positives in BOLD  Gen: Denies fever, chills,  weight change, fatigue, night sweats HEENT: Denies blurred vision, double vision, hearing loss, tinnitus, sinus congestion, rhinorrhea, sore throat, neck stiffness, dysphagia PULM: Denies shortness of breath, cough, sputum production, hemoptysis, wheezing CV: Denies chest pain, edema, orthopnea, paroxysmal nocturnal dyspnea, palpitations GI: Denies abdominal pain, nausea, vomiting, diarrhea, hematochezia, melena, constipation, change in bowel habits GU: Denies dysuria, hematuria,  polyuria, oliguria, urethral discharge Endocrine: Denies hot or cold intolerance, polyuria, polyphagia or appetite change Derm: Denies rash, dry skin, scaling or peeling skin change Heme: Denies easy bruising, bleeding, bleeding gums Neuro: Denies headache, numbness, weakness, slurred speech, loss of memory or consciousness Past Medical History:  He,  has a past medical history of COVID-19 virus infection and GERD (gastroesophageal reflux disease).   Surgical History:   Past Surgical History:  Procedure Laterality Date  . HERNIA REPAIR       Social History:   reports that he has never smoked. He has never used smokeless tobacco. He reports that he does not drink alcohol and does not use drugs.   Family History:  His family history includes Ovarian cancer in his mother.   Allergies No Known Allergies   Home Medications  Prior to Admission medications   Medication Sig Start Date End Date Taking? Authorizing Provider  cefdinir (OMNICEF) 300 MG capsule Take 300 mg by mouth 2 (two) times daily.   Yes [provider]  pantoprazole (PROTONIX) 40 MG tablet Take 40 mg by mouth daily.   Yes [provider]     Critical care time: 35 minutes       Betsey Holiday, AGACNP-BC Acute Care Nurse Practitioner [facility]   [phone] / [phone] Please see Amion for pager details.

## 2020-06-14 NOTE — Progress Notes (Signed)
Pharmacy Antibiotic Note  Jack Barton is a 60 y.o. male admitted on 06/14/2020 with acute PE and multifocal pneumonia.  Patient has recent COVID infection at the beginning of January wand spent several days at [facility] and then was transferred to [facility]. CXR on admisison showing multifocal pneumonia.  Pharmacy has been consulted for vancomycin and cefepime dosing.  Patient received vancomycin 1g IV and cefepime 2g IV in the ED.   Plan: Will order vancomycin 1000mg  x 1 dose for a total loading dose of 2000mg , followed by vancomycin 1250mg  IV every 8 hours.  Goal AUC 400-550. Expected AUC: 426 SCr used: 0.72  Start Cefepime 2g IV every 8 hours.    Height: 6\' 4"  (193 cm) Weight: 104.3 kg (230 lb) IBW/kg (Calculated) : 86.8  Temp (24hrs), Avg:99.7 F (37.6 C), Min:99.5 F (37.5 C), Max:99.9 F (37.7 C)  Recent Labs  Lab 06/14/20 0909 06/14/20 0913  WBC 11.3*  --   CREATININE  --  0.72  LATICACIDVEN 1.5  --     Estimated Creatinine Clearance: 131.9 mL/min (by C-G formula based on SCr of 0.72 mg/dL).    No Known Allergies  Antimicrobials this admission: 2/11 vancomycin >>  2/11 cefepime >>    Microbiology results: 2/11 BCx: pending  2/11 SARS Coronavirus 2: negative    Thank you for allowing pharmacy to be a part of this patient's care.  4/11, PharmD, BCPS Clinical Pharmacist 06/14/2020 12:24 PM

## 2020-06-15 DIAGNOSIS — I82453 Acute embolism and thrombosis of peroneal vein, bilateral: Secondary | ICD-10-CM

## 2020-06-15 DIAGNOSIS — U099 Post covid-19 condition, unspecified: Secondary | ICD-10-CM | POA: Diagnosis not present

## 2020-06-15 DIAGNOSIS — I2609 Other pulmonary embolism with acute cor pulmonale: Secondary | ICD-10-CM | POA: Diagnosis not present

## 2020-06-15 DIAGNOSIS — J9601 Acute respiratory failure with hypoxia: Secondary | ICD-10-CM | POA: Diagnosis not present

## 2020-06-15 LAB — CBC
HCT: 37.1 % — ABNORMAL LOW (ref 39.0–52.0)
Hemoglobin: 12.2 g/dL — ABNORMAL LOW (ref 13.0–17.0)
MCH: 30.1 pg (ref 26.0–34.0)
MCHC: 32.9 g/dL (ref 30.0–36.0)
MCV: 91.6 fL (ref 80.0–100.0)
Platelets: 256 10*3/uL (ref 150–400)
RBC: 4.05 MIL/uL — ABNORMAL LOW (ref 4.22–5.81)
RDW: 12.9 % (ref 11.5–15.5)
WBC: 12.8 10*3/uL — ABNORMAL HIGH (ref 4.0–10.5)
nRBC: 0 % (ref 0.0–0.2)

## 2020-06-15 LAB — ECHOCARDIOGRAM COMPLETE
AR max vel: 2.39 cm2
AV Area VTI: 2.54 cm2
AV Area mean vel: 2.23 cm2
AV Mean grad: 3 mmHg
AV Peak grad: 5.9 mmHg
Ao pk vel: 1.21 m/s
Area-P 1/2: 4.86 cm2
Height: 76 in
S' Lateral: 1.95 cm
Weight: 3680 oz

## 2020-06-15 LAB — BASIC METABOLIC PANEL
Anion gap: 10 (ref 5–15)
BUN: 13 mg/dL (ref 6–20)
CO2: 26 mmol/L (ref 22–32)
Calcium: 8.9 mg/dL (ref 8.9–10.3)
Chloride: 100 mmol/L (ref 98–111)
Creatinine, Ser: 0.87 mg/dL (ref 0.61–1.24)
GFR, Estimated: >60 mL/min (ref >60)
Glucose, Bld: 119 mg/dL — ABNORMAL HIGH (ref 70–99)
Potassium: 3.9 mmol/L (ref 3.5–5.1)
Sodium: 136 mmol/L (ref 135–145)

## 2020-06-15 LAB — HEPARIN LEVEL (UNFRACTIONATED)
Heparin Unfractionated: <0.1 IU/mL — ABNORMAL LOW (ref 0.30–0.70)
Heparin Unfractionated: <0.1 IU/mL — ABNORMAL LOW (ref 0.30–0.70)
Heparin Unfractionated: <0.1 IU/mL — ABNORMAL LOW (ref 0.30–0.70)
Heparin Unfractionated: 0.29 IU/mL — ABNORMAL LOW (ref 0.30–0.70)

## 2020-06-15 LAB — TROPONIN I (HIGH SENSITIVITY): Troponin I (High Sensitivity): 9 ng/L (ref <18)

## 2020-06-15 LAB — PHOSPHORUS: Phosphorus: 3.7 mg/dL (ref 2.5–4.6)

## 2020-06-15 LAB — PROCALCITONIN: Procalcitonin: 0.87 ng/mL

## 2020-06-15 LAB — MAGNESIUM: Magnesium: 1.6 mg/dL — ABNORMAL LOW (ref 1.7–2.4)

## 2020-06-15 LAB — MRSA PCR SCREENING: MRSA by PCR: NEGATIVE

## 2020-06-15 LAB — APTT: aPTT: 57 seconds — ABNORMAL HIGH (ref 24–36)

## 2020-06-15 MED ORDER — HEPARIN BOLUS VIA INFUSION
3000.0000 [IU] | Freq: Once | INTRAVENOUS | Status: AC
  Administered 2020-06-15: 3000 [IU] via INTRAVENOUS
  Filled 2020-06-15: qty 3000

## 2020-06-15 MED ORDER — HEPARIN BOLUS VIA INFUSION
1500.0000 [IU] | Freq: Once | INTRAVENOUS | Status: AC
  Administered 2020-06-15: 1500 [IU] via INTRAVENOUS
  Filled 2020-06-15: qty 1500

## 2020-06-15 MED ORDER — RIVAROXABAN 15 MG PO TABS
15.0000 mg | ORAL_TABLET | Freq: Two times a day (BID) | ORAL | Status: DC
  Administered 2020-06-16: 15 mg via ORAL
  Filled 2020-06-15 (×2): qty 1

## 2020-06-15 MED ORDER — SALINE SPRAY 0.65 % NA SOLN
1.0000 | NASAL | Status: DC | PRN
  Filled 2020-06-15: qty 44

## 2020-06-15 MED ORDER — RIVAROXABAN 20 MG PO TABS: 20.0000 mg | ORAL_TABLET | Freq: Every day | ORAL | Status: DC

## 2020-06-15 MED ORDER — HEPARIN (PORCINE) 25000 UT/250ML-% IV SOLN
2700.0000 [IU]/h | INTRAVENOUS | Status: AC
  Administered 2020-06-15: 2500 [IU]/h via INTRAVENOUS
  Administered 2020-06-15: 2100 [IU]/h via INTRAVENOUS
  Administered 2020-06-16: 2700 [IU]/h via INTRAVENOUS
  Filled 2020-06-15 (×3): qty 250

## 2020-06-15 NOTE — Progress Notes (Signed)
Right wrist IV infiltrated/IV removed/ ice packed applied Pharmacy made aware of IV infiltration/ heparin levels un measurable with lab check. Heparin infusing in left wrist IV. Pharmacy to recheck heparin levels.

## 2020-06-15 NOTE — Progress Notes (Addendum)
ANTICOAGULATION CONSULT NOTE  Pharmacy Consult for Heparin drip Indication: pulmonary embolus  No Known Allergies  Patient Measurements: Height: 6\' 4"  (193 cm) Weight: 103.6 kg (228 lb 4.8 oz) IBW/kg (Calculated) : 86.8 Heparin Dosing Weight: 104 kg  Vital Signs: Temp: 98.6 F (37 C) (02/12 0812) Temp Source: Oral (02/12 0812) BP: 125/77 (02/12 0812) Pulse Rate: 111 (02/12 0812)  Labs: Recent Labs    06/14/20 0909 06/14/20 0913 06/14/20 1956 06/15/20 0012 06/15/20 0946  HGB 13.8  --   --  12.2*  --   HCT 39.9  --   --  37.1*  --   PLT 255  --   --  256  --   APTT 36  --   --  57*  --   LABPROT 13.7  --   --   --   --   INR 1.1  --   --   --   --   HEPARINUNFRC  --   --   --  <0.10* <0.10*  CREATININE  --  0.72  --  0.87  --   TROPONINIHS  --  4 15 9   --     Estimated Creatinine Clearance: 112.2 mL/min (by C-G formula based on SCr of 0.87 mg/dL).   Medical History: Past Medical History:  Diagnosis Date  . COVID-19 virus infection   . GERD (gastroesophageal reflux disease)     Medications:  Scheduled:  . ipratropium-albuterol  3 mL Nebulization Q4H  .  morphine injection  2 mg Intravenous Once  . pantoprazole  40 mg Oral Daily   Infusions:  . ceFEPime (MAXIPIME) IV 2 g (06/15/20 0528)  . heparin 2,100 Units/hr (06/15/20 0435)    Assessment: 60 yo M to start Heparin drip for Pulm.Embolism with R heart strain.  No anticaog PTA per Med Rec Hgb 13.8  Plt 255 INR 1.1  APTT 36 -patient w/ recent Covid-diagnosed 05/06/20 and hospitalized at [facility] then rehab at [facility].  0212 0012 HL <0.10, subtherapeutic, aPTT 57  Heparin 3000 unit bolus x 1.Increase Heparin infusion rate to 2100 units/hr  Goal of Therapy:  Heparin level 0.3-0.7 units/ml Monitor platelets by anticoagulation protocol: Yes   Plan:  2/12  0946 HL <0.10.  Subtherapeutic. Called RN to verify Heparin drip is running. Per RN: appeared Heparin drip infiltrated and Heparin drip was  moved to other arm. Will recheck HL Continue to monitor CBC  *addendum: per Dr.Amin pt is transitioning off Heparin drip to Xarelto on 2/13 am   4/12 PharmD Clinical Pharmacist 06/15/2020

## 2020-06-15 NOTE — Progress Notes (Signed)
PROGRESS NOTE    Jack Barton  LHT:[id number] DOB: 1960-07-14 DOA: 06/14/2020 PCP: [name], FNP   Brief Narrative: Taken from H&P. 60 y.o. male with medical history significant of GERD, recent COVID-19 infection, who presents with shortness breath, left flank pain.  Patient was recently hospitalized due to COVID-19 infection and discharged on 1/27 from [facility].  Patient states that he developed left flank pain in left back and left rib cage since last night.  The pain is pleuritic, sharp, severe, nonradiating, aggravated by deep breath.  He has a dry cough, no fever or chills.  His shortness breath has worsened. Pt states that this morning his O2 sat was low and his heart rate was fast.  No nausea vomiting, diarrhea or abdominal pain.  No symptoms of UTI. Upon arrival to ED, patient was noted to have acute respiratory distress with hyperventilation. Patient was found to have oxygen desaturated to 85% on room air, which improved to 95% on 4 L oxygen. CTA positive for bilateral submassive PEs.  Lower extremity venous Doppler positive for bilateral DVTs.  Vascular surgery was consulted and he was taken for suction embolectomy and thrombolysis. Patient is unvaccinated for COVID-19.  Subjective: Continues to feel short of breath, little tachypneic.  Oxygen nasal cannula was close to his lips, he was stating that it burns in his nose and he normally breath through his mouth.  Wife at bedside.  Assessment & Plan:   Principal Problem:   Acute pulmonary embolism-submassive Active Problems:   GERD (gastroesophageal reflux disease)   Multifocal pneumonia   Sepsis ([place])   Acute respiratory failure with hypoxia (HCC)   DVT (deep venous thrombosis) (HCC)  Acute hypoxic respiratory failure secondary to acute submassive PE and DVTs.  S/p thrombectomy and thrombolysis. Patient repaint little tachypneic and tachycardic, he was on 8 L of oxygen by nasal cannula was close to his lips,  do not know how much he really needs at this time. Echocardiogram with normal EF, grade 1 diastolic dysfunction and no other abnormality. -Continue with heparin infusion for another day-we will transition to Xarelto from tomorrow. -Give him mask as he is a mouth breather and wean oxygen as tolerated to keep the saturation above 90%. -Continue with bronchodilators.  Multifocal pneumonia.  Patient initially met sepsis criteria with tachycardia, tachypnea, temperature of 99.9 and imaging concerning for multifocal pneumonia.  He was started on cefepime and vancomycin.  Initial procalcitonin was negative but increased to 0.87 today. Patient also had PE which can explain all of these. Imaging most likely secondary to recent COVID-19 pneumonia but he is high risk for bacterial infection.  Blood cultures remain negative, strep pneumococcal antigen negative, Legionella pending. -Discontinue vancomycin. -Continue with cefepime -Follow-up sputum cultures.  GERD. -Continue with Protonix  Objective: Vitals:   06/15/20 0803 06/15/20 0812 06/15/20 1208 06/15/20 1245  BP:  125/77 117/77   Pulse:  (!) 111 (!) 118   Resp:  16 18   Temp:  98.6 F (37 C) 98.1 F (36.7 C)   TempSrc:  Oral    SpO2: 94% 92% 93% 95%  Weight:      Height:        Intake/Output Summary (Last 24 hours) at 06/15/2020 1249 Last data filed at 06/15/2020 0950 Gross per 24 hour  Intake 676.02 ml  Output 2090 ml  Net -1413.98 ml   Filed Weights   06/14/20 0837 06/14/20 1538 06/15/20 0404  Weight: 104.3 kg 104.3 kg 103.6 kg  Examination:  General exam: Well-developed gentleman, appears little anxious. Respiratory system: Clear to auscultation, tachypneic with mildly increased work of breathing. Cardiovascular system: Sinus tachycardia Gastrointestinal system: Soft, nontender, nondistended, bowel sounds positive. Central nervous system: Alert and oriented. No focal neurological deficits.Symmetric 5 x 5  power. Extremities: No edema, no cyanosis, pulses intact and symmetrical. Psychiatry: Judgement and insight appear normal. Mood & affect appropriate.    DVT prophylaxis: Heparin infusion Code Status: Full Family Communication: Updated the wife at bedside. Disposition Plan:  Status is: Inpatient  Remains inpatient appropriate because:Inpatient level of care appropriate due to severity of illness   Dispo: The patient is from: Home              Anticipated d/c is to: Home              Anticipated d/c date is: 2 days              Patient currently is not medically stable to d/c.   Difficult to place patient No              Level of care: Progressive Cardiac  Consultants:   Vascular surgery  Procedures:  Suction embolectomy Thrombolysis  Antimicrobials:  Cefepime  Data Reviewed: I have personally reviewed following labs and imaging studies  CBC: Recent Labs  Lab 06/14/20 0909 06/15/20 0012  WBC 11.3* 12.8*  NEUTROABS 8.5*  --   HGB 13.8 12.2*  HCT 39.9 37.1*  MCV 88.9 91.6  PLT 255 951   Basic Metabolic Panel: Recent Labs  Lab 06/14/20 0913 06/15/20 0012  NA 133* 136  K 4.0 3.9  CL 101 100  CO2 23 26  GLUCOSE 120* 119*  BUN 13 13  CREATININE 0.72 0.87  CALCIUM 8.7* 8.9  MG  --  1.6*  PHOS  --  3.7   GFR: Estimated Creatinine Clearance: 112.2 mL/min (by C-G formula based on SCr of 0.87 mg/dL). Liver Function Tests: Recent Labs  Lab 06/14/20 0913  AST 20  ALT 17  ALKPHOS 91  BILITOT 1.2  PROT 7.5  ALBUMIN 3.4*   Recent Labs  Lab 06/14/20 0913  LIPASE 24   No results for input(s): AMMONIA in the last 168 hours. Coagulation Profile: Recent Labs  Lab 06/14/20 0909  INR 1.1   Cardiac Enzymes: No results for input(s): CKTOTAL, CKMB, CKMBINDEX, TROPONINI in the last 168 hours. BNP (last 3 results) No results for input(s): PROBNP in the last 8760 hours. HbA1C: No results for input(s): HGBA1C in the last 72 hours. CBG: No results for  input(s): GLUCAP in the last 168 hours. Lipid Profile: No results for input(s): CHOL, HDL, LDLCALC, TRIG, CHOLHDL, LDLDIRECT in the last 72 hours. Thyroid Function Tests: No results for input(s): TSH, T4TOTAL, FREET4, T3FREE, THYROIDAB in the last 72 hours. Anemia Panel: No results for input(s): VITAMINB12, FOLATE, FERRITIN, TIBC, IRON, RETICCTPCT in the last 72 hours. Sepsis Labs: Recent Labs  Lab 06/14/20 0909 06/14/20 0913 06/15/20 0012  PROCALCITON  --  <0.10 0.87  LATICACIDVEN 1.5  --   --     Recent Results (from the past 240 hour(s))  Blood Culture (routine x 2)     Status: None (Preliminary result)   Collection Time: 06/14/20  9:09 AM   Specimen: BLOOD  Result Value Ref Range Status   Specimen Description BLOOD BLOOD RIGHT FOREARM  Final   Special Requests   Final    BOTTLES DRAWN AEROBIC AND ANAEROBIC Blood Culture results may not be  optimal due to an excessive volume of blood received in culture bottles   Culture   Final    NO GROWTH < 24 HOURS Performed at [facility], [address] [place], [place] [address]    Report Status PENDING  Incomplete  Resp Panel by RT-PCR (Flu A&B, Covid) Nasopharyngeal Swab     Status: None   Collection Time: 06/14/20  9:12 AM   Specimen: Nasopharyngeal Swab; Nasopharyngeal(NP) swabs in vial transport medium  Result Value Ref Range Status   SARS Coronavirus 2 by RT PCR NEGATIVE NEGATIVE Final    Comment: (NOTE) SARS-CoV-2 target nucleic acids are NOT DETECTED.  The SARS-CoV-2 RNA is generally detectable in upper respiratory specimens during the acute phase of infection. The lowest concentration of SARS-CoV-2 viral copies this assay can detect is 138 copies/mL. A negative result does not preclude SARS-Cov-2 infection and should not be used as the sole basis for treatment or other patient management decisions. A negative result may occur with  improper specimen collection/handling, submission of specimen other than  nasopharyngeal swab, presence of viral mutation(s) within the areas targeted by this assay, and inadequate number of viral copies(<138 copies/mL). A negative result must be combined with clinical observations, patient history, and epidemiological information. The expected result is Negative.  Fact Sheet for Patients:  EntrepreneurPulse.com.au  Fact Sheet for Healthcare Providers:  IncredibleEmployment.be  This test is no t yet approved or cleared by the [place] FDA and  has been authorized for detection and/or diagnosis of SARS-CoV-2 by FDA under an Emergency Use Authorization (EUA). This EUA will remain  in effect (meaning this test can be used) for the duration of the COVID-19 declaration under Section 564(b)(1) of the Act, 21 U.S.C.section 360bbb-3(b)(1), unless the authorization is terminated  or revoked sooner.       Influenza A by PCR NEGATIVE NEGATIVE Final   Influenza B by PCR NEGATIVE NEGATIVE Final    Comment: (NOTE) The Xpert Xpress SARS-CoV-2/FLU/RSV plus assay is intended as an aid in the diagnosis of influenza from Nasopharyngeal swab specimens and should not be used as a sole basis for treatment. Nasal washings and aspirates are unacceptable for Xpert Xpress SARS-CoV-2/FLU/RSV testing.  Fact Sheet for Patients: EntrepreneurPulse.com.au  Fact Sheet for Healthcare Providers: IncredibleEmployment.be  This test is not yet approved or cleared by the [place] FDA and has been authorized for detection and/or diagnosis of SARS-CoV-2 by FDA under an Emergency Use Authorization (EUA). This EUA will remain in effect (meaning this test can be used) for the duration of the COVID-19 declaration under Section 564(b)(1) of the Act, 21 U.S.C. section 360bbb-3(b)(1), unless the authorization is terminated or revoked.  Performed at [facility], [address] [place], [place]  [address]   Blood Culture (routine x 2)     Status: None (Preliminary result)   Collection Time: 06/14/20  9:12 AM   Specimen: BLOOD  Result Value Ref Range Status   Specimen Description BLOOD BLOOD LEFT ARM  Final   Special Requests   Final    BOTTLES DRAWN AEROBIC AND ANAEROBIC Blood Culture adequate volume   Culture   Final    NO GROWTH < 24 HOURS Performed at [facility], [address], [place], [place] [address]    Report Status PENDING  Incomplete  MRSA PCR Screening     Status: None   Collection Time: 06/15/20  5:38 AM   Specimen: Nasopharyngeal  Result Value Ref Range Status   MRSA by PCR  NEGATIVE NEGATIVE Final    Comment:        The GeneXpert MRSA Assay (FDA approved for NASAL specimens only), is one component of a comprehensive MRSA colonization surveillance program. It is not intended to diagnose MRSA infection nor to guide or monitor treatment for MRSA infections. Performed at [facility], [address] [place], [place] [address]      Radiology Studies: CT Angio Chest PE W and/or Wo Contrast  Result Date: 06/14/2020 CLINICAL DATA:  Chest and abdominal pain EXAM: CT ANGIOGRAPHY CHEST CT ABDOMEN AND PELVIS WITH CONTRAST TECHNIQUE: Multidetector CT imaging of the chest was performed using the standard protocol during bolus administration of intravenous contrast. Multiplanar CT image reconstructions and MIPs were obtained to evaluate the vascular anatomy. Multidetector CT imaging of the abdomen and pelvis was performed using the standard protocol during bolus administration of intravenous contrast. CONTRAST:  113m OMNIPAQUE IOHEXOL 350 MG/ML SOLN COMPARISON:  Chest radiograph June 14, 2020; CT angiogram chest May 09, 2020 CT abdomen and pelvis August 11 2017 FINDINGS: CTA CHEST FINDINGS Cardiovascular: There are pulmonary emboli arising from each distal main pulmonary artery with extension of pulmonary emboli into multiple lower lobe pulmonary  artery branches bilaterally. Small focus of pulmonary embolus at the origin of the right upper lobe pulmonary artery noted. The right ventricle to left ventricle diameter ratio is 1.2, indicative of a degree of right heart strain. There is no appreciable thoracic aortic aneurysm or dissection. Visualized great vessels appear normal. There are occasional foci of coronary artery calcification. There is no appreciable pericardial effusion or pericardial thickening. Mediastinum/Nodes: Visualized thyroid appears normal. There is no evident thoracic adenopathy by size criteria. There are scattered subcentimeter mediastinal lymph nodes which do not meet size criteria for pathologic significance. No esophageal lesions are evident. Lungs/Pleura: There is airspace opacity throughout both lower lobes with more scattered opacity in the periphery of each upper lobe and right middle lobe. There is suggestion of a degree of underlying fibrotic type change. There are no appreciable pleural effusions. Note that in comparison with most recent chest CT, there is somewhat less opacity in the upper lobes bilaterally. Musculoskeletal: There are no blastic or lytic bone lesions. No chest wall lesions. Review of the MIP images confirms the above findings. CT ABDOMEN and PELVIS FINDINGS Hepatobiliary: There is hepatic steatosis. No focal liver lesions are evident. The gallbladder wall is not appreciably thickened. There is no biliary duct dilatation. Pancreas: There is no pancreatic mass or inflammatory focus. Spleen: No splenic lesions are evident. Adrenals/Urinary Tract: Adrenals bilaterally appear normal. There is a cyst in the medial mid right kidney measuring 1.3 x 1.3 cm. There is no evident hydronephrosis on either side. There is no evident renal or ureteral calculus on either side. Urinary bladder is midline with wall thickness within normal limits. Stomach/Bowel: There is no appreciable bowel wall or mesenteric thickening. Terminal  ileum appears normal. No evident bowel obstruction. There is no evident free air or portal venous air. Vascular/Lymphatic: There is aortic atherosclerosis. No aneurysm evident. Major venous structures appear patent. No adenopathy appreciable in the abdomen or pelvis. Reproductive: There are occasional prostatic calculi. Prostate and seminal vesicles normal in size and configuration. Other: Appendix appears normal. No abscess or ascites in the abdomen or pelvis. There is again noted a left inguinal hernia containing fluid but no bowel. This appearance is stable compared to the previous study. Musculoskeletal: No blastic or lytic bone lesions. No intramuscular lesions are evident. Review of the  MIP images confirms the above findings. IMPRESSION: CT angiogram chest: 1. Pulmonary emboli arise from each distal main pulmonary artery with extension into multiple lower lobe pulmonary artery branches bilaterally. Pulmonary embolus also seen in the proximal aspect of the right upper lobe pulmonary artery. Positive for acute PE with CTevidence of right heart strain (RV/LV Ratio = 1.2) consistent with at least submassive (intermediate risk) PE. The presence of right heart strain has been associated with an increased risk of morbidity and mortality. 2. Multifocal pneumonia with a lower lobe predominance. There appears to be underlying fibrotic change in the periphery of the lower lobes. Somewhat less opacity in the upper lobes compared to most recent CT. Appearance consistent with COVID 19 pneumonitis with potential degree of bacterial superinfection. 3.  Foci of coronary artery calcification noted. 4.  No appreciable adenopathy. CT abdomen and pelvis: 1. No bowel wall thickening or bowel obstruction. No abscess in the abdomen or pelvis. Appendix appears normal. 2. No appreciable renal or ureteral calculus. No hydronephrosis. Urinary bladder wall thickness normal. 3. Stable small inguinal hernia on the left containing fluid but  no bowel. 4. Hepatic steatosis. 5.  Aortic Atherosclerosis (ICD10-I70.0). Critical Value/emergent results were called by telephone at the time of interpretation on 06/14/2020 at 11:42 am to provider MARK QUALE , who verbally acknowledged these results. Electronically Signed   By: Lowella Grip III M.D.   On: 06/14/2020 11:43   CT ABDOMEN PELVIS W CONTRAST  Result Date: 06/14/2020 CLINICAL DATA:  Chest and abdominal pain EXAM: CT ANGIOGRAPHY CHEST CT ABDOMEN AND PELVIS WITH CONTRAST TECHNIQUE: Multidetector CT imaging of the chest was performed using the standard protocol during bolus administration of intravenous contrast. Multiplanar CT image reconstructions and MIPs were obtained to evaluate the vascular anatomy. Multidetector CT imaging of the abdomen and pelvis was performed using the standard protocol during bolus administration of intravenous contrast. CONTRAST:  151m OMNIPAQUE IOHEXOL 350 MG/ML SOLN COMPARISON:  Chest radiograph June 14, 2020; CT angiogram chest May 09, 2020 CT abdomen and pelvis August 11 2017 FINDINGS: CTA CHEST FINDINGS Cardiovascular: There are pulmonary emboli arising from each distal main pulmonary artery with extension of pulmonary emboli into multiple lower lobe pulmonary artery branches bilaterally. Small focus of pulmonary embolus at the origin of the right upper lobe pulmonary artery noted. The right ventricle to left ventricle diameter ratio is 1.2, indicative of a degree of right heart strain. There is no appreciable thoracic aortic aneurysm or dissection. Visualized great vessels appear normal. There are occasional foci of coronary artery calcification. There is no appreciable pericardial effusion or pericardial thickening. Mediastinum/Nodes: Visualized thyroid appears normal. There is no evident thoracic adenopathy by size criteria. There are scattered subcentimeter mediastinal lymph nodes which do not meet size criteria for pathologic significance. No esophageal  lesions are evident. Lungs/Pleura: There is airspace opacity throughout both lower lobes with more scattered opacity in the periphery of each upper lobe and right middle lobe. There is suggestion of a degree of underlying fibrotic type change. There are no appreciable pleural effusions. Note that in comparison with most recent chest CT, there is somewhat less opacity in the upper lobes bilaterally. Musculoskeletal: There are no blastic or lytic bone lesions. No chest wall lesions. Review of the MIP images confirms the above findings. CT ABDOMEN and PELVIS FINDINGS Hepatobiliary: There is hepatic steatosis. No focal liver lesions are evident. The gallbladder wall is not appreciably thickened. There is no biliary duct dilatation. Pancreas: There is no pancreatic mass or inflammatory  focus. Spleen: No splenic lesions are evident. Adrenals/Urinary Tract: Adrenals bilaterally appear normal. There is a cyst in the medial mid right kidney measuring 1.3 x 1.3 cm. There is no evident hydronephrosis on either side. There is no evident renal or ureteral calculus on either side. Urinary bladder is midline with wall thickness within normal limits. Stomach/Bowel: There is no appreciable bowel wall or mesenteric thickening. Terminal ileum appears normal. No evident bowel obstruction. There is no evident free air or portal venous air. Vascular/Lymphatic: There is aortic atherosclerosis. No aneurysm evident. Major venous structures appear patent. No adenopathy appreciable in the abdomen or pelvis. Reproductive: There are occasional prostatic calculi. Prostate and seminal vesicles normal in size and configuration. Other: Appendix appears normal. No abscess or ascites in the abdomen or pelvis. There is again noted a left inguinal hernia containing fluid but no bowel. This appearance is stable compared to the previous study. Musculoskeletal: No blastic or lytic bone lesions. No intramuscular lesions are evident. Review of the MIP  images confirms the above findings. IMPRESSION: CT angiogram chest: 1. Pulmonary emboli arise from each distal main pulmonary artery with extension into multiple lower lobe pulmonary artery branches bilaterally. Pulmonary embolus also seen in the proximal aspect of the right upper lobe pulmonary artery. Positive for acute PE with CTevidence of right heart strain (RV/LV Ratio = 1.2) consistent with at least submassive (intermediate risk) PE. The presence of right heart strain has been associated with an increased risk of morbidity and mortality. 2. Multifocal pneumonia with a lower lobe predominance. There appears to be underlying fibrotic change in the periphery of the lower lobes. Somewhat less opacity in the upper lobes compared to most recent CT. Appearance consistent with COVID 19 pneumonitis with potential degree of bacterial superinfection. 3.  Foci of coronary artery calcification noted. 4.  No appreciable adenopathy. CT abdomen and pelvis: 1. No bowel wall thickening or bowel obstruction. No abscess in the abdomen or pelvis. Appendix appears normal. 2. No appreciable renal or ureteral calculus. No hydronephrosis. Urinary bladder wall thickness normal. 3. Stable small inguinal hernia on the left containing fluid but no bowel. 4. Hepatic steatosis. 5.  Aortic Atherosclerosis (ICD10-I70.0). Critical Value/emergent results were called by telephone at the time of interpretation on 06/14/2020 at 11:42 am to provider MARK QUALE , who verbally acknowledged these results. Electronically Signed   By: Lowella Grip III M.D.   On: 06/14/2020 11:43   PERIPHERAL VASCULAR CATHETERIZATION  Result Date: 06/14/2020 See op note  US Venous Img Lower Bilateral (DVT)  Result Date: 06/14/2020 CLINICAL DATA:  Acute PE. EXAM: BILATERAL LOWER EXTREMITY VENOUS DOPPLER ULTRASOUND TECHNIQUE: Gray-scale sonography with compression, as well as color and duplex ultrasound, were performed to evaluate the deep venous system(s)  from the level of the common femoral vein through the popliteal and proximal calf veins. COMPARISON:  None. FINDINGS: VENOUS Occlusive thrombus noted in the right posterior tibial vein and peroneal vein. Occlusive thrombus noted in the left popliteal vein, posterior tibial vein and peroneal vein. Normal compressibility of the common femoral, superficial femoral, and right popliteal veins. Visualized portions of profunda femoral vein and great saphenous vein unremarkable. OTHER None. Limitations: none IMPRESSION: 1. Acute deep venous thrombosis of the right and left lower extremities, on the left from the popliteal through the posterior tibial and peroneal veins and on the right involving the posterior tibial and peroneal veins. Right popliteal vein and both common femoral and femoral veins widely patent. Electronically Signed   By: Lajean Manes  M.D.   On: 06/14/2020 13:55   DG Chest Port 1 View  Result Date: 06/14/2020 CLINICAL DATA:  Shortness of breath. EXAM: PORTABLE CHEST 1 VIEW COMPARISON:  06/14/2020 FINDINGS: Again noted are diffuse bilateral airspace opacities with interval worsening since the prior study earlier today. There is no pneumothorax. There are probable small bilateral pleural effusions, left greater than right. The heart size is stable. There is no acute osseous abnormality. IMPRESSION: Worsening multifocal airspace opacities bilaterally consistent with multifocal pneumonia. Electronically Signed   By: Constance Holster M.D.   On: 06/14/2020 21:27   DG Chest Port 1 View  Result Date: 06/14/2020 CLINICAL DATA:  Tachycardia with decreased oxygen saturation EXAM: PORTABLE CHEST 1 VIEW COMPARISON:  May 06, 2020 FINDINGS: There is patchy airspace opacity in the left mid lung region as well as in both lower lung regions. Heart size and pulmonary vascularity are normal. No adenopathy. No bone lesions. IMPRESSION: Patchy airspace opacity at several sites, more severe than on prior study.  Appearance raises concern for multifocal atypical organism pneumonia. Check of COVID-19 status in this regard advised. No frank consolidation. Heart size normal. Electronically Signed   By: Lowella Grip III M.D.   On: 06/14/2020 08:54   ECHOCARDIOGRAM COMPLETE  Result Date: 06/15/2020    ECHOCARDIOGRAM REPORT   Patient Name:   LARWENCE TU Date of Exam: 06/14/2020 Medical Rec #:  [id number]      Height:       76.0 in Accession #:    [id number]     Weight:       230.0 lb Date of Birth:  07/25/1960     BSA:          2.351 m Patient Age:    16 years       BP:           130/87 mmHg Patient Gender: M              HR:           119 bpm. Exam Location:  [facility] Procedure: 2D Echo, Cardiac Doppler and Color Doppler Indications:     Pulmonary embolus 415.19 /I26.99  History:         Patient has no prior history of Echocardiogram examinations.                  Covid-19 survivor.  Sonographer:     Sherrie Sport RDCS (AE) Referring Phys:  [id number] Soledad Gerlach NIU Diagnosing Phys: Neoma Laming MD IMPRESSIONS  1. Left ventricular ejection fraction, by estimation, is 60 to 65%. The left ventricle has normal function. The left ventricle has no regional wall motion abnormalities. Left ventricular diastolic parameters are consistent with Grade I diastolic dysfunction (impaired relaxation).  2. Right ventricular systolic function is normal. The right ventricular size is normal.  3. The mitral valve is normal in structure. Trivial mitral valve regurgitation. No evidence of mitral stenosis.  4. The aortic valve is normal in structure. Aortic valve regurgitation is not visualized. No aortic stenosis is present.  5. The inferior vena cava is normal in size with greater than 50% respiratory variability, suggesting right atrial pressure of 3 mmHg. FINDINGS  Left Ventricle: Left ventricular ejection fraction, by estimation, is 60 to 65%. The left ventricle has normal function. The left ventricle has no regional wall motion abnormalities. The left  ventricular internal cavity size was normal in size. There is  no left ventricular hypertrophy. Left ventricular diastolic parameters are consistent with Grade  I diastolic dysfunction (impaired relaxation). Right Ventricle: The right ventricular size is normal. No increase in right ventricular wall thickness. Right ventricular systolic function is normal. Left Atrium: Left atrial size was normal in size. Right Atrium: Right atrial size was normal in size. Pericardium: There is no evidence of pericardial effusion. Mitral Valve: The mitral valve is normal in structure. Trivial mitral valve regurgitation. No evidence of mitral valve stenosis. Tricuspid Valve: The tricuspid valve is normal in structure. Tricuspid valve regurgitation is trivial. No evidence of tricuspid stenosis. Aortic Valve: The aortic valve is normal in structure. Aortic valve regurgitation is not visualized. No aortic stenosis is present. Aortic valve mean gradient measures 3.0 mmHg. Aortic valve peak gradient measures 5.9 mmHg. Aortic valve area, by VTI measures 2.54 cm. Pulmonic Valve: The pulmonic valve was normal in structure. Pulmonic valve regurgitation is not visualized. No evidence of pulmonic stenosis. Aorta: The aortic root is normal in size and structure. Venous: The inferior vena cava is normal in size with greater than 50% respiratory variability, suggesting right atrial pressure of 3 mmHg. IAS/Shunts: No atrial level shunt detected by color flow Doppler.  LEFT VENTRICLE PLAX 2D LVIDd:         1.25 cm  Diastology LVIDs:         1.95 cm  LV e' medial:    5.66 cm/s LV PW:         0.60 cm  LV E/e' medial:  11.1 LV IVS:        2.15 cm  LV e' lateral:   7.94 cm/s LVOT diam:     2.00 cm  LV E/e' lateral: 7.9 LV SV:         41 LV SV Index:   17 LVOT Area:     3.14 cm  RIGHT VENTRICLE RV Basal diam:  3.77 cm RV S prime:     14.10 cm/s TAPSE (M-mode): 3.3 cm LEFT ATRIUM             Index       RIGHT ATRIUM           Index LA diam:        3.50  cm 1.49 cm/m  RA Area:     15.30 cm LA Vol (A2C):   34.8 ml 14.80 ml/m RA Volume:   38.50 ml  16.38 ml/m LA Vol (A4C):   39.3 ml 16.72 ml/m LA Biplane Vol: 39.2 ml 16.68 ml/m  AORTIC VALVE                   PULMONIC VALVE AV Area (Vmax):    2.39 cm    PV Vmax:        0.80 m/s AV Area (Vmean):   2.23 cm    PV Peak grad:   2.5 mmHg AV Area (VTI):     2.54 cm    RVOT Peak grad: 4 mmHg AV Vmax:           121.00 cm/s AV Vmean:          84.000 cm/s AV VTI:            0.161 m AV Peak Grad:      5.9 mmHg AV Mean Grad:      3.0 mmHg LVOT Vmax:         92.00 cm/s LVOT Vmean:        59.700 cm/s LVOT VTI:          0.130 m LVOT/AV VTI ratio:  0.81  AORTA Ao Root diam: 2.50 cm MITRAL VALVE               TRICUSPID VALVE MV Area (PHT): 4.86 cm    TR Peak grad:   6.7 mmHg MV Decel Time: 156 msec    TR Vmax:        129.00 cm/s MV E velocity: 63.00 cm/s MV A velocity: 86.10 cm/s  SHUNTS MV E/A ratio:  0.73        Systemic VTI:  0.13 m                            Systemic Diam: 2.00 cm Neoma Laming MD Electronically signed by Neoma Laming MD Signature Date/Time: 06/15/2020/11:36:19 AM    Final     Scheduled Meds: . ipratropium-albuterol  3 mL Nebulization Q4H  .  morphine injection  2 mg Intravenous Once  . pantoprazole  40 mg Oral Daily   Continuous Infusions: . ceFEPime (MAXIPIME) IV 2 g (06/15/20 0528)  . heparin 2,100 Units/hr (06/15/20 0435)     LOS: 1 day   Time spent: 45 minutes.  Lorella Nimrod, MD [facility]  If 7PM-7AM, please contact night-coverage [email or web address]  06/15/2020, 12:49 PM   This record has been created using Systems analyst. Errors have been sought and corrected,but may not always be located. Such creation errors do not reflect on the standard of care.

## 2020-06-15 NOTE — Progress Notes (Signed)
ANTICOAGULATION CONSULT NOTE  Pharmacy Consult for Heparin drip Indication: pulmonary embolus  No Known Allergies  Patient Measurements: Height: 6\' 4"  (193 cm) Weight: 103.6 kg (228 lb 4.8 oz) IBW/kg (Calculated) : 86.8 Heparin Dosing Weight: 104 kg  Vital Signs: Temp: 98.1 F (36.7 C) (02/12 1208) Temp Source: Oral (02/12 0812) BP: 117/77 (02/12 1208) Pulse Rate: 118 (02/12 1208)  Labs: Recent Labs    06/14/20 0909 06/14/20 0913 06/14/20 1956 06/15/20 0012 06/15/20 0946 06/15/20 1343  HGB 13.8  --   --  12.2*  --   --   HCT 39.9  --   --  37.1*  --   --   PLT 255  --   --  256  --   --   APTT 36  --   --  57*  --   --   LABPROT 13.7  --   --   --   --   --   INR 1.1  --   --   --   --   --   HEPARINUNFRC  --   --   --  <0.10* <0.10* <0.10*  CREATININE  --  0.72  --  0.87  --   --   TROPONINIHS  --  4 15 9   --   --     Estimated Creatinine Clearance: 112.2 mL/min (by C-G formula based on SCr of 0.87 mg/dL).   Medical History: Past Medical History:  Diagnosis Date  . COVID-19 virus infection   . GERD (gastroesophageal reflux disease)     Medications:  Scheduled:  . ipratropium-albuterol  3 mL Nebulization Q4H  .  morphine injection  2 mg Intravenous Once  . pantoprazole  40 mg Oral Daily  . [START ON 06/16/2020] Rivaroxaban  15 mg Oral BID WC   Followed by  . [START ON 07/07/2020] rivaroxaban  20 mg Oral Q supper   Infusions:  . ceFEPime (MAXIPIME) IV 2 g (06/15/20 1421)  . heparin 2,100 Units/hr (06/15/20 0435)    Assessment: 60 yo M to start Heparin drip for Pulm.Embolism with R heart strain.  No anticaog PTA per Med Rec Hgb 13.8  Plt 255 INR 1.1  APTT 36 -patient w/ recent Covid-diagnosed 05/06/20 and hospitalized at [facility] then rehab at [facility].  0212 0012 HL <0.10, subtherapeutic, aPTT 57  Heparin 3000 unit bolus x 1.Increase Heparin infusion rate to 2100 units/hr 2/12  0946 HL <0.10.  Subtherapeutic. Called RN to verify Heparin drip is  running. Per RN: appeared Heparin drip infiltrated and Heparin drip was moved to other arm.  Goal of Therapy:  Heparin level 0.3-0.7 units/ml Monitor platelets by anticoagulation protocol: Yes   Plan:  2/12  1343 HL <0.10 again.  Subtherapeutic. Called RN to verify Heparin drip is running and she confirmed it is.  Will bolus with 3100 units and increase drip rate to 2500 units/hr. Will recheck HL Continue to monitor CBC *Addendum: per Dr.Amin pt is transitioning off Heparin drip to Xarelto on 2/13 am   4/12 PharmD Clinical Pharmacist 06/15/2020

## 2020-06-15 NOTE — Progress Notes (Signed)
ANTICOAGULATION CONSULT NOTE  Pharmacy Consult for Heparin drip Indication: pulmonary embolus  No Known Allergies  Patient Measurements: Height: 6\' 4"  (193 cm) Weight: 103.6 kg (228 lb 4.8 oz) IBW/kg (Calculated) : 86.8 Heparin Dosing Weight: 104 kg  Vital Signs: Temp: 98.7 F (37.1 C) (02/12 2106) Temp Source: Oral (02/12 2106) BP: 121/71 (02/12 2106) Pulse Rate: 119 (02/12 2106)  Labs: Recent Labs    06/14/20 0909 06/14/20 0913 06/14/20 1956 06/15/20 0012 06/15/20 0012 06/15/20 0946 06/15/20 1343 06/15/20 2136  HGB 13.8  --   --  12.2*  --   --   --   --   HCT 39.9  --   --  37.1*  --   --   --   --   PLT 255  --   --  256  --   --   --   --   APTT 36  --   --  57*  --   --   --   --   LABPROT 13.7  --   --   --   --   --   --   --   INR 1.1  --   --   --   --   --   --   --   HEPARINUNFRC  --   --   --  <0.10*   < > <0.10* <0.10* 0.29*  CREATININE  --  0.72  --  0.87  --   --   --   --   TROPONINIHS  --  4 15 9   --   --   --   --    < > = values in this interval not displayed.    Estimated Creatinine Clearance: 112.2 mL/min (by C-G formula based on SCr of 0.87 mg/dL).   Medical History: Past Medical History:  Diagnosis Date  . COVID-19 virus infection   . GERD (gastroesophageal reflux disease)     Medications:  Scheduled:  . ipratropium-albuterol  3 mL Nebulization Q4H  .  morphine injection  2 mg Intravenous Once  . pantoprazole  40 mg Oral Daily  . [START ON 06/16/2020] Rivaroxaban  15 mg Oral BID WC   Followed by  . [START ON 07/07/2020] rivaroxaban  20 mg Oral Q supper   Infusions:  . ceFEPime (MAXIPIME) IV 2 g (06/15/20 2153)  . heparin 2,500 Units/hr (06/15/20 2153)    Assessment: 60 yo M to start Heparin drip for Pulm.Embolism with R heart strain.  No anticaog PTA per Med Rec Hgb 13.8  Plt 255 INR 1.1  APTT 36 -patient w/ recent Covid-diagnosed 05/06/20 and hospitalized at [facility] then rehab at [facility].  0212 0012 HL <0.10,  subtherapeutic, aPTT 57  Heparin 3000 unit bolus x 1.Increase Heparin infusion rate to 2100 units/hr 2/12  0946 HL <0.10.  Subtherapeutic. Called RN to verify Heparin drip is running. Per RN: appeared Heparin drip infiltrated and Heparin drip was moved to other arm.  Goal of Therapy:  Heparin level 0.3-0.7 units/ml Monitor platelets by anticoagulation protocol: Yes   Plan:  2/12  2136 HL 0.29 - Subtherapeutic. Will bolus with 1500 units and increase drip rate to 2700 units/hr. Will recheck HL in 6 hours  Continue to monitor CBC *Addendum: per Dr.Amin pt is transitioning off Heparin drip to Xarelto on 2/13 am   2137, PharmD Clinical Pharmacist 06/15/2020

## 2020-06-15 NOTE — Progress Notes (Addendum)
ANTICOAGULATION CONSULT NOTE - Initial Consult  Pharmacy Consult for Heparin drip Indication: pulmonary embolus  No Known Allergies  Patient Measurements: Height: 6\' 4"  (193 cm) Weight: 104.3 kg (229 lb 15 oz) IBW/kg (Calculated) : 86.8 Heparin Dosing Weight: 104 kg  Vital Signs: Temp: 98.8 F (37.1 C) (02/11 1855) Temp Source: Oral (02/11 1855) BP: 135/94 (02/11 1855) Pulse Rate: 131 (02/11 1855)  Labs: Recent Labs    06/14/20 0909 06/14/20 0913 06/14/20 1956 06/15/20 0012  HGB 13.8  --   --  12.2*  HCT 39.9  --   --  37.1*  PLT 255  --   --  256  APTT 36  --   --  57*  LABPROT 13.7  --   --   --   INR 1.1  --   --   --   HEPARINUNFRC  --   --   --  <0.10*  CREATININE  --  0.72  --  0.87  TROPONINIHS  --  4 15 9     Estimated Creatinine Clearance: 121.3 mL/min (by C-G formula based on SCr of 0.87 mg/dL).   Medical History: Past Medical History:  Diagnosis Date  . COVID-19 virus infection   . GERD (gastroesophageal reflux disease)     Medications:  Scheduled:  . heparin sodium (porcine)      . ipratropium-albuterol  3 mL Nebulization Q4H  .  morphine injection  2 mg Intravenous Once  . pantoprazole  40 mg Oral Daily   Infusions:  . ceFEPime (MAXIPIME) IV 2 g (06/14/20 2052)  . heparin 1,800 Units/hr (06/14/20 2333)  . vancomycin 1,250 mg (06/14/20 2146)    Assessment: 59 yo M to start Heparin drip for Pulm.Embolism with R heart strain.  No anticaog PTA per Med Rec Hgb 13.8  Plt 255 INR 1.1  APTT 36 -patient w/ recent Covid-diagnosed 05/06/20 and hospitalized at [facility] then rehab at [facility].  0212 0012 HL <0.10, subtherapeutic, aPTT 57  Goal of Therapy:  Heparin level 0.3-0.7 units/ml Monitor platelets by anticoagulation protocol: Yes   Plan:  Heparin 3000 unit bolus x 1 Increase Heparin infusion rate to 2100 units/hr Check HL in 6 hours. Continue to monitor H&H and Plts.  [facility], PharmD, [facility] 06/15/2020 3:52 AM

## 2020-06-16 DIAGNOSIS — I82453 Acute embolism and thrombosis of peroneal vein, bilateral: Secondary | ICD-10-CM | POA: Diagnosis not present

## 2020-06-16 DIAGNOSIS — U099 Post covid-19 condition, unspecified: Secondary | ICD-10-CM | POA: Diagnosis not present

## 2020-06-16 DIAGNOSIS — J9601 Acute respiratory failure with hypoxia: Secondary | ICD-10-CM | POA: Diagnosis not present

## 2020-06-16 DIAGNOSIS — I2609 Other pulmonary embolism with acute cor pulmonale: Secondary | ICD-10-CM | POA: Diagnosis not present

## 2020-06-16 LAB — BASIC METABOLIC PANEL
Anion gap: 10 (ref 5–15)
BUN: 16 mg/dL (ref 6–20)
CO2: 24 mmol/L (ref 22–32)
Calcium: 8.5 mg/dL — ABNORMAL LOW (ref 8.9–10.3)
Chloride: 98 mmol/L (ref 98–111)
Creatinine, Ser: 0.71 mg/dL (ref 0.61–1.24)
GFR, Estimated: >60 mL/min (ref >60)
Glucose, Bld: 117 mg/dL — ABNORMAL HIGH (ref 70–99)
Potassium: 3.7 mmol/L (ref 3.5–5.1)
Sodium: 132 mmol/L — ABNORMAL LOW (ref 135–145)

## 2020-06-16 LAB — RETICULOCYTES
Immature Retic Fract: 28 % — ABNORMAL HIGH (ref 2.3–15.9)
RBC.: 3.46 MIL/uL — ABNORMAL LOW (ref 4.22–5.81)
Retic Count, Absolute: 55 10*3/uL (ref 19.0–186.0)
Retic Ct Pct: 1.6 % (ref 0.4–3.1)

## 2020-06-16 LAB — IRON AND TIBC
Iron: 22 ug/dL — ABNORMAL LOW (ref 45–182)
Saturation Ratios: 8 % — ABNORMAL LOW (ref 17.9–39.5)
TIBC: 262 ug/dL (ref 250–450)
UIBC: 240 ug/dL

## 2020-06-16 LAB — CBC
HCT: 31.4 % — ABNORMAL LOW (ref 39.0–52.0)
Hemoglobin: 10.5 g/dL — ABNORMAL LOW (ref 13.0–17.0)
MCH: 30.3 pg (ref 26.0–34.0)
MCHC: 33.4 g/dL (ref 30.0–36.0)
MCV: 90.5 fL (ref 80.0–100.0)
Platelets: 251 10*3/uL (ref 150–400)
RBC: 3.47 MIL/uL — ABNORMAL LOW (ref 4.22–5.81)
RDW: 13 % (ref 11.5–15.5)
WBC: 9.4 10*3/uL (ref 4.0–10.5)
nRBC: 0 % (ref 0.0–0.2)

## 2020-06-16 LAB — FOLATE: Folate: 15.2 ng/mL (ref >5.9)

## 2020-06-16 LAB — VITAMIN B12: Vitamin B-12: 391 pg/mL (ref 180–914)

## 2020-06-16 LAB — FERRITIN: Ferritin: 1332 ng/mL — ABNORMAL HIGH (ref 24–336)

## 2020-06-16 LAB — PROCALCITONIN: Procalcitonin: 0.87 ng/mL

## 2020-06-16 LAB — HEPARIN LEVEL (UNFRACTIONATED): Heparin Unfractionated: 0.5 IU/mL (ref 0.30–0.70)

## 2020-06-16 MED ORDER — RIVAROXABAN (XARELTO) VTE STARTER PACK (15 & 20 MG): ORAL_TABLET | ORAL | 0 refills | Status: DC

## 2020-06-16 MED ORDER — DM-GUAIFENESIN ER 30-600 MG PO TB12: 1.0000 | ORAL_TABLET | Freq: Two times a day (BID) | ORAL | 0 refills | Status: DC | PRN

## 2020-06-16 MED ORDER — IPRATROPIUM-ALBUTEROL 0.5-2.5 (3) MG/3ML IN SOLN: 3.0000 mL | RESPIRATORY_TRACT | 1 refills | Status: DC | PRN

## 2020-06-16 NOTE — TOC Initial Note (Addendum)
Transition of Care [facility]) - Initial/Assessment Note    Patient Details  Name: Jack Barton MRN: [id number] Date of Birth: 1961/05/04  [facility] CM/SW Contact:    Verna Czech [place], [place] Phone Number: 06/16/2020, 11:46 AM  Clinical Narrative:                 Patient 60 y.o. male with medical history significant of GERD, recent COVID-19 infection, who presents with shortness breath, left flank pain. Patient to discharge home today. Patient's spouse to provide transport. Patient discharging with orders for Oxygen. [employer] contacted, they have accepted patient and will be delivering the oxygen to patient's room before he discharges. ETA 1:30pm.  1:49pm [employer] contacted to add nebulizer to request. If driver has one with him, he will deliver to patient when delivering the oxygen. If not, they will deliver it to the home.     [name], LCSW Transitions of Care [phone]   Expected Discharge Plan: Home/Self Care Barriers to Discharge: No Barriers Identified   Patient Goals and CMS Choice Patient states their goals for this hospitalization and ongoing recovery are:: To return home with oxyen      Expected Discharge Plan and Services Expected Discharge Plan: Home/Self Care     Post Acute Care Choice: Durable Medical Equipment Living arrangements for the past 2 months: Single Family Home                 DME Arranged: Oxygen DME Agency: Other - Comment Electrical engineer) Date DME Agency Contacted: 06/16/20 Time DME Agency Contacted: [phone] Representative spoke with at DME Agency: Jack Barton[facility] HH Arranged: NA          Prior Living Arrangements/Services Living arrangements for the past 2 months: Single Family Home Lives with:: Spouse Patient language and need for interpreter reviewed:: Yes Do you feel safe going back to the place where you live?: Yes      Need for Family Participation in Patient Care: Yes (Comment) Care giver support system in place?:  Yes (comment) Current home services: DME Criminal Activity/Legal Involvement Pertinent to Current Situation/Hospitalization: No - Comment as needed  Activities of Daily Living Home Assistive Devices/Equipment: None ADL Screening (condition at time of admission) Patient's cognitive ability adequate to safely complete daily activities?: Yes Is the patient deaf or have difficulty hearing?: No Does the patient have difficulty seeing, even when wearing glasses/contacts?: No Does the patient have difficulty concentrating, remembering, or making decisions?: No Patient able to express need for assistance with ADLs?: Yes Does the patient have difficulty dressing or bathing?: No Independently performs ADLs?: Yes (appropriate for developmental age) Does the patient have difficulty walking or climbing stairs?: Yes Weakness of Legs: Both Weakness of Arms/Hands: None  Permission Sought/Granted   Permission granted to share information with : Yes, Verbal Permission Granted  Share Information with NAME: [employer]-Jack Barton  Permission granted to share info w AGENCY: [employer]  Permission granted to share info w Relationship: O2 company  Permission granted to share info w Contact Information: [phone]  Emotional Assessment   Attitude/Demeanor/Rapport: Engaged   Orientation: : Oriented to Self,Oriented to Place,Oriented to  Time Alcohol / Substance Use: Not Applicable Psych Involvement: No (comment)  Admission diagnosis:  SOB (shortness of breath) [R06.02] Acute pulmonary embolism (HCC) [I26.99] HCAP (healthcare-associated pneumonia) [J18.9] Acute hypoxemic respiratory failure (HCC) [J96.01] Multifocal pneumonia [J18.9] Acute pulmonary embolism, unspecified pulmonary embolism type, unspecified whether acute cor pulmonale present (HCC) [I26.99] Long COVID [U09.9] Patient Active Problem List   Diagnosis Date Noted  .  Long COVID   . Multifocal pneumonia 06/14/2020  . Sepsis (HCC) 06/14/2020  .  Acute hypoxemic respiratory failure (HCC) 06/14/2020  . Acute pulmonary embolism-submassive 06/14/2020  . DVT (deep venous thrombosis) (HCC) 06/14/2020  . GERD (gastroesophageal reflux disease)    PCP:  Gordy Councilman, FNP Pharmacy:   [facility] - [address] [place] - [address] [place] [address] Phone: [phone] Fax: [phone]     Social Determinants of Health (SDOH) Interventions    Readmission Risk Interventions No flowsheet data found.

## 2020-06-16 NOTE — Progress Notes (Signed)
Oxygen delivered to pt's room. Discharge instructions explained and pt and wife, Misty Stanley. verbalized understanding. IV and tele removed. Will transport off unit via wheelchair.

## 2020-06-16 NOTE — Progress Notes (Addendum)
SATURATION QUALIFICATIONS: (This note is used to comply with regulatory documentation for home oxygen)  Patient Saturations on Room Air at Rest = 91%  Patient Saturations on Room Air while Ambulating = 83%  Patient Saturations on 4 Liters of oxygen while Ambulating = 95%  Please briefly explain why patient needs home oxygen: oxygen saturations drop with ambulation

## 2020-06-16 NOTE — Progress Notes (Signed)
PT Cancellation Note  Patient Details Name: Jack Barton MRN: [id number] DOB: [birth year]   Cancelled Treatment:    Reason Eval/Treat Not Completed:  (Consult received and chart reviewed. Patient currently sitting "indian-style" in bed; wife at bedside.  Per primary RN, patient and wife, patient has been indep mobile in room and recently completed full lap around nursing station for O2 qualification.  Patient voices comfort with gait performance; confident in abilities and need for activity pacing/energy conservation.  Wife in agreement.  Decline PT evaluation at this time, but in agreement with HHPT follow up on discharge.  Will continue efforts next date if patient remains in-house.)   Kongmeng Santoro H. Manson Passey, PT, DPT, NCS 06/16/20, 2:06 PM [phone]

## 2020-06-16 NOTE — Progress Notes (Signed)
Patient requested to try HFNC as strap on nrb was hurting head. Patient wanted to try 4 liters. o2 sat on 6 noted at 92. Told him will stop there for now since he has periods of sob at times

## 2020-06-16 NOTE — Discharge Summary (Signed)
Physician Discharge Summary  Jack Barton WJX:[id number] DOB: 08/03/1960 DOA: 06/14/2020  PCP: Gordy Councilman, FNP  Admit date: 06/14/2020 Discharge date: 06/16/2020  Admitted From: Home Disposition: Home  Recommendations for Outpatient Follow-up:  1. Follow up with PCP in 1-2 weeks 2. Follow-up with pulmonology 3. Please obtain BMP/CBC in one week 4. Please follow up on the following pending results: None  Home Health: Yes Equipment/Devices: Home oxygen Discharge Condition: Stable CODE STATUS: Full Diet recommendation: Heart Healthy   Brief/Interim Summary: 60 y.o.malewith medical history significant ofGERD, recent COVID-19 infection, who presents with shortness breath, left flank pain.  Patient was recently hospitalized due to COVID-19 infection and discharged on 1/27 from [facility]. Patient states that he developed left flank pain inleft backandleft rib cage since last night. The pain is pleuritic, sharp, severe, nonradiating, aggravated by deep breath. He has a dry cough, no fever or chills. His shortness breath has worsened.Pt states that this morning his O2 sat was low and his heart rate wasfast.No nausea vomiting, diarrhea or abdominal pain. No symptoms of UTI. Upon arrival to ED, patient was noted to have acute respiratory distress with hyperventilation. Patient was found to have oxygen desaturated to 85% on room air, which improved to 95% on 4 L oxygen. CTA positive for bilateral submassive PEs.  Lower extremity venous Doppler positive for bilateral DVTs.  Vascular surgery was consulted and he was taken for suction embolectomy and thrombolysis.  He was initially managed with heparin infusion while in the hospital and then later transitioned to Xarelto, he will take Xarelto twice daily for 21 days followed by once daily.  He will need at least 3 months of anticoagulation and his primary care provider can decide regarding continuation on case-by-case  basis.  Patient was also found to have bilateral infiltrates, most likely secondary to his recent COVID-19 pneumonia.  Blood cultures remain negative, because of his high risk of bacterial infection he was treated with cefepime while in the hospital and apparently had 2 more days of cefdinir course left just before coming to the hospital so he was instructed to complete that cefdinir and he will follow-up with his primary care provider and pulmonologist for further recommendations.  Patient continued to require oxygen, required 2 L while resting but his requirement goes up to 4 L of oxygen with ambulation.  Home oxygen and a nebulizer machine was ordered and he can continue with DuoNeb treatments as needed.  His pulmonologist should be able to evaluate for further oxygen needs.  Discharge Diagnoses:  Principal Problem:   Acute pulmonary embolism-submassive Active Problems:   GERD (gastroesophageal reflux disease)   Multifocal pneumonia   Sepsis (HCC)   Acute hypoxemic respiratory failure (HCC)   DVT (deep venous thrombosis) (HCC)   Long COVID   Discharge Instructions  Discharge Instructions    Diet - low sodium heart healthy   Complete by: As directed    Discharge instructions   Complete by: As directed    It was pleasure taking care of you. You can continue with your cefdinir and finish the course. You are being given a new blood thinner called Xarelto, you will take twice daily dose as directed for first 21 days and then start daily dose.  Please ask your primary care doctor to give you a refill of your regular dose as you will need it for at least 53-month or more. Follow-up with your lung doctor for further recommendations.   Increase activity slowly   Complete by:  As directed      Allergies as of 06/16/2020   No Known Allergies     Medication List    TAKE these medications   cefdinir 300 MG capsule Commonly known as: OMNICEF Take 300 mg by mouth 2 (two) times daily.    dextromethorphan-guaiFENesin 30-600 MG 12hr tablet Commonly known as: MUCINEX DM Take 1 tablet by mouth 2 (two) times daily as needed for cough.   ipratropium-albuterol 0.5-2.5 (3) MG/3ML Soln Commonly known as: DUONEB Take 3 mLs by nebulization every 4 (four) hours as needed.   pantoprazole 40 MG tablet Commonly known as: PROTONIX Take 40 mg by mouth daily.   Rivaroxaban Stater Pack (15 mg and 20 mg) Commonly known as: XARELTO STARTER PACK Follow package directions: Take one  tablet by mouth twice a day. On day 22, switch to one  tablet once a day. Take with food.            Durable Medical Equipment  (From admission, onward)         Start     Ordered   06/16/20 1250  For home use only DME Nebulizer machine  Once       Question Answer Comment  Patient needs a nebulizer to treat with the following condition Post covid-19 condition, unspecified   Length of Need Lifetime      06/16/20 1250   06/16/20 1130  For home use only DME oxygen  Once       Question Answer Comment  Length of Need Lifetime   Mode or (Route) Nasal cannula   Liters per Minute 4   Frequency Continuous (stationary and portable oxygen unit needed)   Oxygen conserving device Yes   Oxygen delivery system Gas      06/16/20 1129          Follow-up Information    Gordy Councilman, FNP. Schedule an appointment as soon as possible for a visit.   Specialty: Family Medicine Contact information: [facility] [address] [place] [address] [phone]              No Known Allergies  Consultations:  Vascular surgery  Procedures/Studies:  Suction embolectomy Thrombolysis  CT Angio Chest PE W and/or Wo Contrast  Result Date: 06/14/2020 CLINICAL DATA:  Chest and abdominal pain EXAM: CT ANGIOGRAPHY CHEST CT ABDOMEN AND PELVIS WITH CONTRAST TECHNIQUE: Multidetector CT imaging of the chest was performed using the standard protocol during bolus administration of  intravenous contrast. Multiplanar CT image reconstructions and MIPs were obtained to evaluate the vascular anatomy. Multidetector CT imaging of the abdomen and pelvis was performed using the standard protocol during bolus administration of intravenous contrast. CONTRAST:  OMNIPAQUE IOHEXOL 350 MG/ML SOLN COMPARISON:  Chest radiograph June 14, 2020; CT angiogram chest May 09, 2020 CT abdomen and pelvis August 11 2017 FINDINGS: CTA CHEST FINDINGS Cardiovascular: There are pulmonary emboli arising from each distal main pulmonary artery with extension of pulmonary emboli into multiple lower lobe pulmonary artery branches bilaterally. Small focus of pulmonary embolus at the origin of the right upper lobe pulmonary artery noted. The right ventricle to left ventricle diameter ratio is 1.2, indicative of a degree of right heart strain. There is no appreciable thoracic aortic aneurysm or dissection. Visualized great vessels appear normal. There are occasional foci of coronary artery calcification. There is no appreciable pericardial effusion or pericardial thickening. Mediastinum/Nodes: Visualized thyroid appears normal. There is no evident thoracic adenopathy by size criteria. There are  scattered subcentimeter mediastinal lymph nodes which do not meet size criteria for pathologic significance. No esophageal lesions are evident. Lungs/Pleura: There is airspace opacity throughout both lower lobes with more scattered opacity in the periphery of each upper lobe and right middle lobe. There is suggestion of a degree of underlying fibrotic type change. There are no appreciable pleural effusions. Note that in comparison with most recent chest CT, there is somewhat less opacity in the upper lobes bilaterally. Musculoskeletal: There are no blastic or lytic bone lesions. No chest wall lesions. Review of the MIP images confirms the above findings. CT ABDOMEN and PELVIS FINDINGS Hepatobiliary: There is hepatic steatosis. No  focal liver lesions are evident. The gallbladder wall is not appreciably thickened. There is no biliary duct dilatation. Pancreas: There is no pancreatic mass or inflammatory focus. Spleen: No splenic lesions are evident. Adrenals/Urinary Tract: Adrenals bilaterally appear normal. There is a cyst in the medial mid right kidney measuring 1.3 x 1.3 cm. There is no evident hydronephrosis on either side. There is no evident renal or ureteral calculus on either side. Urinary bladder is midline with wall thickness within normal limits. Stomach/Bowel: There is no appreciable bowel wall or mesenteric thickening. Terminal ileum appears normal. No evident bowel obstruction. There is no evident free air or portal venous air. Vascular/Lymphatic: There is aortic atherosclerosis. No aneurysm evident. Major venous structures appear patent. No adenopathy appreciable in the abdomen or pelvis. Reproductive: There are occasional prostatic calculi. Prostate and seminal vesicles normal in size and configuration. Other: Appendix appears normal. No abscess or ascites in the abdomen or pelvis. There is again noted a left inguinal hernia containing fluid but no bowel. This appearance is stable compared to the previous study. Musculoskeletal: No blastic or lytic bone lesions. No intramuscular lesions are evident. Review of the MIP images confirms the above findings. IMPRESSION: CT angiogram chest: 1. Pulmonary emboli arise from each distal main pulmonary artery with extension into multiple lower lobe pulmonary artery branches bilaterally. Pulmonary embolus also seen in the proximal aspect of the right upper lobe pulmonary artery. Positive for acute PE with CTevidence of right heart strain (RV/LV Ratio = 1.2) consistent with at least submassive (intermediate risk) PE. The presence of right heart strain has been associated with an increased risk of morbidity and mortality. 2. Multifocal pneumonia with a lower lobe predominance. There appears  to be underlying fibrotic change in the periphery of the lower lobes. Somewhat less opacity in the upper lobes compared to most recent CT. Appearance consistent with COVID 19 pneumonitis with potential degree of bacterial superinfection. 3.  Foci of coronary artery calcification noted. 4.  No appreciable adenopathy. CT abdomen and pelvis: 1. No bowel wall thickening or bowel obstruction. No abscess in the abdomen or pelvis. Appendix appears normal. 2. No appreciable renal or ureteral calculus. No hydronephrosis. Urinary bladder wall thickness normal. 3. Stable small inguinal hernia on the left containing fluid but no bowel. 4. Hepatic steatosis. 5.  Aortic Atherosclerosis (ICD10-I70.0). Critical Value/emergent results were called by telephone at the time of interpretation on 06/14/2020 at 11:42 am to provider MARK QUALE , who verbally acknowledged these results. Electronically Signed   By: Bretta Bang III M.D.   On: 06/14/2020 11:43   CT ABDOMEN PELVIS W CONTRAST  Result Date: 06/14/2020 CLINICAL DATA:  Chest and abdominal pain EXAM: CT ANGIOGRAPHY CHEST CT ABDOMEN AND PELVIS WITH CONTRAST TECHNIQUE: Multidetector CT imaging of the chest was performed using the standard protocol during bolus administration of intravenous contrast.  Multiplanar CT image reconstructions and MIPs were obtained to evaluate the vascular anatomy. Multidetector CT imaging of the abdomen and pelvis was performed using the standard protocol during bolus administration of intravenous contrast. CONTRAST:  OMNIPAQUE IOHEXOL 350 MG/ML SOLN COMPARISON:  Chest radiograph June 14, 2020; CT angiogram chest May 09, 2020 CT abdomen and pelvis August 11 2017 FINDINGS: CTA CHEST FINDINGS Cardiovascular: There are pulmonary emboli arising from each distal main pulmonary artery with extension of pulmonary emboli into multiple lower lobe pulmonary artery branches bilaterally. Small focus of pulmonary embolus at the origin of the right  upper lobe pulmonary artery noted. The right ventricle to left ventricle diameter ratio is 1.2, indicative of a degree of right heart strain. There is no appreciable thoracic aortic aneurysm or dissection. Visualized great vessels appear normal. There are occasional foci of coronary artery calcification. There is no appreciable pericardial effusion or pericardial thickening. Mediastinum/Nodes: Visualized thyroid appears normal. There is no evident thoracic adenopathy by size criteria. There are scattered subcentimeter mediastinal lymph nodes which do not meet size criteria for pathologic significance. No esophageal lesions are evident. Lungs/Pleura: There is airspace opacity throughout both lower lobes with more scattered opacity in the periphery of each upper lobe and right middle lobe. There is suggestion of a degree of underlying fibrotic type change. There are no appreciable pleural effusions. Note that in comparison with most recent chest CT, there is somewhat less opacity in the upper lobes bilaterally. Musculoskeletal: There are no blastic or lytic bone lesions. No chest wall lesions. Review of the MIP images confirms the above findings. CT ABDOMEN and PELVIS FINDINGS Hepatobiliary: There is hepatic steatosis. No focal liver lesions are evident. The gallbladder wall is not appreciably thickened. There is no biliary duct dilatation. Pancreas: There is no pancreatic mass or inflammatory focus. Spleen: No splenic lesions are evident. Adrenals/Urinary Tract: Adrenals bilaterally appear normal. There is a cyst in the medial mid right kidney measuring 1.3 x 1.3 cm. There is no evident hydronephrosis on either side. There is no evident renal or ureteral calculus on either side. Urinary bladder is midline with wall thickness within normal limits. Stomach/Bowel: There is no appreciable bowel wall or mesenteric thickening. Terminal ileum appears normal. No evident bowel obstruction. There is no evident free air or  portal venous air. Vascular/Lymphatic: There is aortic atherosclerosis. No aneurysm evident. Major venous structures appear patent. No adenopathy appreciable in the abdomen or pelvis. Reproductive: There are occasional prostatic calculi. Prostate and seminal vesicles normal in size and configuration. Other: Appendix appears normal. No abscess or ascites in the abdomen or pelvis. There is again noted a left inguinal hernia containing fluid but no bowel. This appearance is stable compared to the previous study. Musculoskeletal: No blastic or lytic bone lesions. No intramuscular lesions are evident. Review of the MIP images confirms the above findings. IMPRESSION: CT angiogram chest: 1. Pulmonary emboli arise from each distal main pulmonary artery with extension into multiple lower lobe pulmonary artery branches bilaterally. Pulmonary embolus also seen in the proximal aspect of the right upper lobe pulmonary artery. Positive for acute PE with CTevidence of right heart strain (RV/LV Ratio = 1.2) consistent with at least submassive (intermediate risk) PE. The presence of right heart strain has been associated with an increased risk of morbidity and mortality. 2. Multifocal pneumonia with a lower lobe predominance. There appears to be underlying fibrotic change in the periphery of the lower lobes. Somewhat less opacity in the upper lobes compared to most recent CT.  Appearance consistent with COVID 19 pneumonitis with potential degree of bacterial superinfection. 3.  Foci of coronary artery calcification noted. 4.  No appreciable adenopathy. CT abdomen and pelvis: 1. No bowel wall thickening or bowel obstruction. No abscess in the abdomen or pelvis. Appendix appears normal. 2. No appreciable renal or ureteral calculus. No hydronephrosis. Urinary bladder wall thickness normal. 3. Stable small inguinal hernia on the left containing fluid but no bowel. 4. Hepatic steatosis. 5.  Aortic Atherosclerosis (ICD10-I70.0). Critical  Value/emergent results were called by telephone at the time of interpretation on 06/14/2020 at 11:42 am to provider MARK QUALE , who verbally acknowledged these results. Electronically Signed   By: Bretta BangWilliam  Woodruff III M.D.   On: 06/14/2020 11:43   PERIPHERAL VASCULAR CATHETERIZATION  Result Date: 06/14/2020 See op note  US Venous Img Lower Bilateral (DVT)  Result Date: 06/14/2020 CLINICAL DATA:  Acute PE. EXAM: BILATERAL LOWER EXTREMITY VENOUS DOPPLER ULTRASOUND TECHNIQUE: Gray-scale sonography with compression, as well as color and duplex ultrasound, were performed to evaluate the deep venous system(s) from the level of the common femoral vein through the popliteal and proximal calf veins. COMPARISON:  None. FINDINGS: VENOUS Occlusive thrombus noted in the right posterior tibial vein and peroneal vein. Occlusive thrombus noted in the left popliteal vein, posterior tibial vein and peroneal vein. Normal compressibility of the common femoral, superficial femoral, and right popliteal veins. Visualized portions of profunda femoral vein and great saphenous vein unremarkable. OTHER None. Limitations: none IMPRESSION: 1. Acute deep venous thrombosis of the right and left lower extremities, on the left from the popliteal through the posterior tibial and peroneal veins and on the right involving the posterior tibial and peroneal veins. Right popliteal vein and both common femoral and femoral veins widely patent. Electronically Signed   By: Amie Portlandavid  Ormond M.D.   On: 06/14/2020 13:55   DG Chest Port 1 View  Result Date: 06/14/2020 CLINICAL DATA:  Shortness of breath. EXAM: PORTABLE CHEST 1 VIEW COMPARISON:  06/14/2020 FINDINGS: Again noted are diffuse bilateral airspace opacities with interval worsening since the prior study earlier today. There is no pneumothorax. There are probable small bilateral pleural effusions, left greater than right. The heart size is stable. There is no acute osseous abnormality.  IMPRESSION: Worsening multifocal airspace opacities bilaterally consistent with multifocal pneumonia. Electronically Signed   By: Katherine Mantlehristopher  Green M.D.   On: 06/14/2020 21:27   DG Chest Port 1 View  Result Date: 06/14/2020 CLINICAL DATA:  Tachycardia with decreased oxygen saturation EXAM: PORTABLE CHEST 1 VIEW COMPARISON:  May 06, 2020 FINDINGS: There is patchy airspace opacity in the left mid lung region as well as in both lower lung regions. Heart size and pulmonary vascularity are normal. No adenopathy. No bone lesions. IMPRESSION: Patchy airspace opacity at several sites, more severe than on prior study. Appearance raises concern for multifocal atypical organism pneumonia. Check of COVID-19 status in this regard advised. No frank consolidation. Heart size normal. Electronically Signed   By: Bretta BangWilliam  Woodruff III M.D.   On: 06/14/2020 08:54   ECHOCARDIOGRAM COMPLETE  Result Date: 06/15/2020    ECHOCARDIOGRAM REPORT   Patient Name:   Jack Barton Date of Exam: 06/14/2020 Medical Rec #:  [id number]      Height:       76.0 in Accession #:    [id number]     Weight:       230.0 lb Date of Birth:  June 25, 1960     BSA:  2.351 m Patient Age:    59 years       BP:           130/87 mmHg Patient Gender: M              HR:           119 bpm. Exam Location:  [facility] Procedure: 2D Echo, Cardiac Doppler and Color Doppler Indications:     Pulmonary embolus 415.19 /I26.99  History:         Patient has no prior history of Echocardiogram examinations.                  Covid-19 survivor.  Sonographer:     Cristela Blue RDCS (AE) Referring Phys:  [id number] Brien Few NIU Diagnosing Phys: Adrian Blackwater MD IMPRESSIONS  1. Left ventricular ejection fraction, by estimation, is 60 to 65%. The left ventricle has normal function. The left ventricle has no regional wall motion abnormalities. Left ventricular diastolic parameters are consistent with Grade I diastolic dysfunction (impaired relaxation).  2. Right ventricular systolic  function is normal. The right ventricular size is normal.  3. The mitral valve is normal in structure. Trivial mitral valve regurgitation. No evidence of mitral stenosis.  4. The aortic valve is normal in structure. Aortic valve regurgitation is not visualized. No aortic stenosis is present.  5. The inferior vena cava is normal in size with greater than 50% respiratory variability, suggesting right atrial pressure of 3 mmHg. FINDINGS  Left Ventricle: Left ventricular ejection fraction, by estimation, is 60 to 65%. The left ventricle has normal function. The left ventricle has no regional wall motion abnormalities. The left ventricular internal cavity size was normal in size. There is  no left ventricular hypertrophy. Left ventricular diastolic parameters are consistent with Grade I diastolic dysfunction (impaired relaxation). Right Ventricle: The right ventricular size is normal. No increase in right ventricular wall thickness. Right ventricular systolic function is normal. Left Atrium: Left atrial size was normal in size. Right Atrium: Right atrial size was normal in size. Pericardium: There is no evidence of pericardial effusion. Mitral Valve: The mitral valve is normal in structure. Trivial mitral valve regurgitation. No evidence of mitral valve stenosis. Tricuspid Valve: The tricuspid valve is normal in structure. Tricuspid valve regurgitation is trivial. No evidence of tricuspid stenosis. Aortic Valve: The aortic valve is normal in structure. Aortic valve regurgitation is not visualized. No aortic stenosis is present. Aortic valve mean gradient measures 3.0 mmHg. Aortic valve peak gradient measures 5.9 mmHg. Aortic valve area, by VTI measures 2.54 cm. Pulmonic Valve: The pulmonic valve was normal in structure. Pulmonic valve regurgitation is not visualized. No evidence of pulmonic stenosis. Aorta: The aortic root is normal in size and structure. Venous: The inferior vena cava is normal in size with greater  than 50% respiratory variability, suggesting right atrial pressure of 3 mmHg. IAS/Shunts: No atrial level shunt detected by color flow Doppler.  LEFT VENTRICLE PLAX 2D LVIDd:         1.25 cm  Diastology LVIDs:         1.95 cm  LV e' medial:    5.66 cm/s LV PW:         0.60 cm  LV E/e' medial:  11.1 LV IVS:        2.15 cm  LV e' lateral:   7.94 cm/s LVOT diam:     2.00 cm  LV E/e' lateral: 7.9 LV SV:  41 LV SV Index:   17 LVOT Area:     3.14 cm  RIGHT VENTRICLE RV Basal diam:  3.77 cm RV S prime:     14.10 cm/s TAPSE (M-mode): 3.3 cm LEFT ATRIUM             Index       RIGHT ATRIUM           Index LA diam:        3.50 cm 1.49 cm/m  RA Area:     15.30 cm LA Vol (A2C):   34.8 ml 14.80 ml/m RA Volume:   38.50 ml  16.38 ml/m LA Vol (A4C):   39.3 ml 16.72 ml/m LA Biplane Vol: 39.2 ml 16.68 ml/m  AORTIC VALVE                   PULMONIC VALVE AV Area (Vmax):    2.39 cm    PV Vmax:        0.80 m/s AV Area (Vmean):   2.23 cm    PV Peak grad:   2.5 mmHg AV Area (VTI):     2.54 cm    RVOT Peak grad: 4 mmHg AV Vmax:           121.00 cm/s AV Vmean:          84.000 cm/s AV VTI:            0.161 m AV Peak Grad:      5.9 mmHg AV Mean Grad:      3.0 mmHg LVOT Vmax:         92.00 cm/s LVOT Vmean:        59.700 cm/s LVOT VTI:          0.130 m LVOT/AV VTI ratio: 0.81  AORTA Ao Root diam: 2.50 cm MITRAL VALVE               TRICUSPID VALVE MV Area (PHT): 4.86 cm    TR Peak grad:   6.7 mmHg MV Decel Time: 156 msec    TR Vmax:        129.00 cm/s MV E velocity: 63.00 cm/s MV A velocity: 86.10 cm/s  SHUNTS MV E/A ratio:  0.73        Systemic VTI:  0.13 m                            Systemic Diam: 2.00 cm Adrian Blackwater MD Electronically signed by Adrian Blackwater MD Signature Date/Time: 06/15/2020/11:36:19 AM    Final      Subjective: Patient was still having shortness of breath with ambulation.  He really wants to go home.  He was desaturating with ambulation requiring up to 4 L of oxygen. Wife at bedside.  Discharge  Exam: Vitals:   06/16/20 0759 06/16/20 1251  BP: 110/64 125/77  Pulse: 99 (!) 109  Resp: 18 20  Temp: 98.2 F (36.8 C) 98.3 F (36.8 C)  SpO2: 93% 92%   Vitals:   06/16/20 0734 06/16/20 0742 06/16/20 0759 06/16/20 1251  BP:   110/64 125/77  Pulse:   99 (!) 109  Resp:   18 20  Temp:   98.2 F (36.8 C) 98.3 F (36.8 C)  TempSrc:   Oral Oral  SpO2: 94% 92% 93% 92%  Weight:      Height:        General: Pt is alert, awake, not in acute distress  Cardiovascular: RRR, S1/S2 +, no rubs, no gallops Respiratory: Bilateral scattered dry crackles. Abdominal: Soft, NT, ND, bowel sounds + Extremities: no edema, no cyanosis   The results of significant diagnostics from this hospitalization (including imaging, microbiology, ancillary and laboratory) are listed below for reference.    Microbiology: Recent Results (from the past 240 hour(s))  Blood Culture (routine x 2)     Status: None (Preliminary result)   Collection Time: 06/14/20  9:09 AM   Specimen: BLOOD  Result Value Ref Range Status   Specimen Description BLOOD BLOOD RIGHT FOREARM  Final   Special Requests   Final    BOTTLES DRAWN AEROBIC AND ANAEROBIC Blood Culture results may not be optimal due to an excessive volume of blood received in culture bottles   Culture   Final    NO GROWTH 2 DAYS Performed at [facility], [address] [place], [place] [address]    Report Status PENDING  Incomplete  Resp Panel by RT-PCR (Flu A&B, Covid) Nasopharyngeal Swab     Status: None   Collection Time: 06/14/20  9:12 AM   Specimen: Nasopharyngeal Swab; Nasopharyngeal(NP) swabs in vial transport medium  Result Value Ref Range Status   SARS Coronavirus 2 by RT PCR NEGATIVE NEGATIVE Final    Comment: (NOTE) SARS-CoV-2 target nucleic acids are NOT DETECTED.  The SARS-CoV-2 RNA is generally detectable in upper respiratory specimens during the acute phase of infection. The lowest concentration of SARS-CoV-2 viral copies this  assay can detect is 138 copies/mL. A negative result does not preclude SARS-Cov-2 infection and should not be used as the sole basis for treatment or other patient management decisions. A negative result may occur with  improper specimen collection/handling, submission of specimen other than nasopharyngeal swab, presence of viral mutation(s) within the areas targeted by this assay, and inadequate number of viral copies(<138 copies/mL). A negative result must be combined with clinical observations, patient history, and epidemiological information. The expected result is Negative.  Fact Sheet for Patients:  BloggerCourse.com  Fact Sheet for Healthcare Providers:  SeriousBroker.it  This test is no t yet approved or cleared by the [place] FDA and  has been authorized for detection and/or diagnosis of SARS-CoV-2 by FDA under an Emergency Use Authorization (EUA). This EUA will remain  in effect (meaning this test can be used) for the duration of the COVID-19 declaration under Section 564(b)(1) of the Act, 21 U.S.C.section 360bbb-3(b)(1), unless the authorization is terminated  or revoked sooner.       Influenza A by PCR NEGATIVE NEGATIVE Final   Influenza B by PCR NEGATIVE NEGATIVE Final    Comment: (NOTE) The Xpert Xpress SARS-CoV-2/FLU/RSV plus assay is intended as an aid in the diagnosis of influenza from Nasopharyngeal swab specimens and should not be used as a sole basis for treatment. Nasal washings and aspirates are unacceptable for Xpert Xpress SARS-CoV-2/FLU/RSV testing.  Fact Sheet for Patients: BloggerCourse.com  Fact Sheet for Healthcare Providers: SeriousBroker.it  This test is not yet approved or cleared by the [place] FDA and has been authorized for detection and/or diagnosis of SARS-CoV-2 by FDA under an Emergency Use Authorization (EUA). This EUA will  remain in effect (meaning this test can be used) for the duration of the COVID-19 declaration under Section 564(b)(1) of the Act, 21 U.S.C. section 360bbb-3(b)(1), unless the authorization is terminated or revoked.  Performed at [facility], [address], [place], [place] [address]   Blood Culture (routine x 2)  Status: None (Preliminary result)   Collection Time: 06/14/20  9:12 AM   Specimen: BLOOD  Result Value Ref Range Status   Specimen Description BLOOD BLOOD LEFT ARM  Final   Special Requests   Final    BOTTLES DRAWN AEROBIC AND ANAEROBIC Blood Culture adequate volume   Culture   Final    NO GROWTH 2 DAYS Performed at [facility], [address], [place], [place] [address]    Report Status PENDING  Incomplete  MRSA PCR Screening     Status: None   Collection Time: 06/15/20  5:38 AM   Specimen: Nasopharyngeal  Result Value Ref Range Status   MRSA by PCR NEGATIVE NEGATIVE Final    Comment:        The GeneXpert MRSA Assay (FDA approved for NASAL specimens only), is one component of a comprehensive MRSA colonization surveillance program. It is not intended to diagnose MRSA infection nor to guide or monitor treatment for MRSA infections. Performed at [facility], [address] [place], [place] [address]      Labs: BNP (last 3 results) Recent Labs    06/14/20 0909  BNP 16.1   Basic Metabolic Panel: Recent Labs  Lab 06/14/20 0913 06/15/20 0012 06/16/20 0455  NA 133* 136 132*  K 4.0 3.9 3.7  CL 101 100 98  CO2 23 26 24   GLUCOSE 120* 119* 117*  BUN 13 13 16   CREATININE 0.72 0.87 0.71  CALCIUM 8.7* 8.9 8.5*  MG  --  1.6*  --   PHOS  --  3.7  --    Liver Function Tests: Recent Labs  Lab 06/14/20 0913  AST 20  ALT 17  ALKPHOS 91  BILITOT 1.2  PROT 7.5  ALBUMIN 3.4*   Recent Labs  Lab 06/14/20 0913  LIPASE 24   No results for input(s): AMMONIA in the last 168 hours. CBC: Recent Labs  Lab 06/14/20 0909  06/15/20 0012 06/16/20 0455  WBC 11.3* 12.8* 9.4  NEUTROABS 8.5*  --   --   HGB 13.8 12.2* 10.5*  HCT 39.9 37.1* 31.4*  MCV 88.9 91.6 90.5  PLT 255 256 251   Cardiac Enzymes: No results for input(s): CKTOTAL, CKMB, CKMBINDEX, TROPONINI in the last 168 hours. BNP: Invalid input(s): POCBNP CBG: No results for input(s): GLUCAP in the last 168 hours. D-Dimer No results for input(s): DDIMER in the last 72 hours. Hgb A1c No results for input(s): HGBA1C in the last 72 hours. Lipid Profile No results for input(s): CHOL, HDL, LDLCALC, TRIG, CHOLHDL, LDLDIRECT in the last 72 hours. Thyroid function studies No results for input(s): TSH, T4TOTAL, T3FREE, THYROIDAB in the last 72 hours.  Invalid input(s): FREET3 Anemia work up Recent Labs    06/16/20 0908  FOLATE 15.2  FERRITIN 1,332*  TIBC 262  IRON 22*  RETICCTPCT 1.6   Urinalysis    Component Value Date/Time   COLORURINE YELLOW (A) 06/14/2020 0913   APPEARANCEUR HAZY (A) 06/14/2020 0913   LABSPEC 1.020 06/14/2020 0913   PHURINE 5.0 06/14/2020 0913   GLUCOSEU NEGATIVE 06/14/2020 0913   HGBUR NEGATIVE 06/14/2020 0913   BILIRUBINUR NEGATIVE 06/14/2020 0913   KETONESUR NEGATIVE 06/14/2020 0913   PROTEINUR NEGATIVE 06/14/2020 0913   NITRITE NEGATIVE 06/14/2020 0913   LEUKOCYTESUR NEGATIVE 06/14/2020 0913   Sepsis Labs Invalid input(s): PROCALCITONIN,  WBC,  LACTICIDVEN Microbiology Recent Results (from the past 240 hour(s))  Blood Culture (routine x 2)     Status: None (Preliminary result)  Collection Time: 06/14/20  9:09 AM   Specimen: BLOOD  Result Value Ref Range Status   Specimen Description BLOOD BLOOD RIGHT FOREARM  Final   Special Requests   Final    BOTTLES DRAWN AEROBIC AND ANAEROBIC Blood Culture results may not be optimal due to an excessive volume of blood received in culture bottles   Culture   Final    NO GROWTH 2 DAYS Performed at [facility], [address], [place], [place] [address]     Report Status PENDING  Incomplete  Resp Panel by RT-PCR (Flu A&B, Covid) Nasopharyngeal Swab     Status: None   Collection Time: 06/14/20  9:12 AM   Specimen: Nasopharyngeal Swab; Nasopharyngeal(NP) swabs in vial transport medium  Result Value Ref Range Status   SARS Coronavirus 2 by RT PCR NEGATIVE NEGATIVE Final    Comment: (NOTE) SARS-CoV-2 target nucleic acids are NOT DETECTED.  The SARS-CoV-2 RNA is generally detectable in upper respiratory specimens during the acute phase of infection. The lowest concentration of SARS-CoV-2 viral copies this assay can detect is 138 copies/mL. A negative result does not preclude SARS-Cov-2 infection and should not be used as the sole basis for treatment or other patient management decisions. A negative result may occur with  improper specimen collection/handling, submission of specimen other than nasopharyngeal swab, presence of viral mutation(s) within the areas targeted by this assay, and inadequate number of viral copies(<138 copies/mL). A negative result must be combined with clinical observations, patient history, and epidemiological information. The expected result is Negative.  Fact Sheet for Patients:  BloggerCourse.com  Fact Sheet for Healthcare Providers:  SeriousBroker.it  This test is no t yet approved or cleared by the [place] FDA and  has been authorized for detection and/or diagnosis of SARS-CoV-2 by FDA under an Emergency Use Authorization (EUA). This EUA will remain  in effect (meaning this test can be used) for the duration of the COVID-19 declaration under Section 564(b)(1) of the Act, 21 U.S.C.section 360bbb-3(b)(1), unless the authorization is terminated  or revoked sooner.       Influenza A by PCR NEGATIVE NEGATIVE Final   Influenza B by PCR NEGATIVE NEGATIVE Final    Comment: (NOTE) The Xpert Xpress SARS-CoV-2/FLU/RSV plus assay is intended as an aid in the  diagnosis of influenza from Nasopharyngeal swab specimens and should not be used as a sole basis for treatment. Nasal washings and aspirates are unacceptable for Xpert Xpress SARS-CoV-2/FLU/RSV testing.  Fact Sheet for Patients: BloggerCourse.com  Fact Sheet for Healthcare Providers: SeriousBroker.it  This test is not yet approved or cleared by the [place] FDA and has been authorized for detection and/or diagnosis of SARS-CoV-2 by FDA under an Emergency Use Authorization (EUA). This EUA will remain in effect (meaning this test can be used) for the duration of the COVID-19 declaration under Section 564(b)(1) of the Act, 21 U.S.C. section 360bbb-3(b)(1), unless the authorization is terminated or revoked.  Performed at [facility], [address] [place], [place] [address]   Blood Culture (routine x 2)     Status: None (Preliminary result)   Collection Time: 06/14/20  9:12 AM   Specimen: BLOOD  Result Value Ref Range Status   Specimen Description BLOOD BLOOD LEFT ARM  Final   Special Requests   Final    BOTTLES DRAWN AEROBIC AND ANAEROBIC Blood Culture adequate volume   Culture   Final    NO GROWTH 2 DAYS Performed at [facility], [address] [place], [place] [address]    Report Status PENDING  Incomplete  MRSA PCR Screening     Status: None   Collection Time: 06/15/20  5:38 AM   Specimen: Nasopharyngeal  Result Value Ref Range Status   MRSA by PCR NEGATIVE NEGATIVE Final    Comment:        The GeneXpert MRSA Assay (FDA approved for NASAL specimens only), is one component of a comprehensive MRSA colonization surveillance program. It is not intended to diagnose MRSA infection nor to guide or monitor treatment for MRSA infections. Performed at [facility], [address] [place], [place] [address]     Time coordinating discharge: Over 30 minutes  SIGNED:  Arnetha Courser,  MD  [facility] 06/16/2020, 12:54 PM  If 7PM-7AM, please contact night-coverage [email or web address]  This record has been created using Conservation officer, historic buildings. Errors have been sought and corrected,but may not always be located. Such creation errors do not reflect on the standard of care.

## 2020-06-16 NOTE — Progress Notes (Signed)
ANTICOAGULATION CONSULT NOTE  Pharmacy Consult for Heparin drip Indication: pulmonary embolus  No Known Allergies  Patient Measurements: Height: 6\' 4"  (193 cm) Weight: 102.9 kg (226 lb 14.6 oz) IBW/kg (Calculated) : 86.8 Heparin Dosing Weight: 104 kg  Vital Signs: Temp: 98.2 F (36.8 C) (02/13 0407) Temp Source: Oral (02/13 0407) BP: 113/73 (02/13 0407) Pulse Rate: 114 (02/13 0407)  Labs: Recent Labs    06/14/20 0909 06/14/20 0913 06/14/20 1956 06/15/20 0012 06/15/20 0946 06/15/20 1343 06/15/20 2136 06/16/20 0455  HGB 13.8  --   --  12.2*  --   --   --  10.5*  HCT 39.9  --   --  37.1*  --   --   --  31.4*  PLT 255  --   --  256  --   --   --  251  APTT 36  --   --  57*  --   --   --   --   LABPROT 13.7  --   --   --   --   --   --   --   INR 1.1  --   --   --   --   --   --   --   HEPARINUNFRC  --   --   --  <0.10*   < > <0.10* 0.29* 0.50  CREATININE  --  0.72  --  0.87  --   --   --   --   TROPONINIHS  --  4 15 9   --   --   --   --    < > = values in this interval not displayed.    Estimated Creatinine Clearance: 112.2 mL/min (by C-G formula based on SCr of 0.87 mg/dL).   Medical History: Past Medical History:  Diagnosis Date  . COVID-19 virus infection   . GERD (gastroesophageal reflux disease)     Medications:  Scheduled:  . ipratropium-albuterol  3 mL Nebulization Q4H  .  morphine injection  2 mg Intravenous Once  . pantoprazole  40 mg Oral Daily  . Rivaroxaban  15 mg Oral BID WC   Followed by  . [START ON 07/07/2020] rivaroxaban  20 mg Oral Q supper   Infusions:  . ceFEPime (MAXIPIME) IV 2 g (06/16/20 0609)  . heparin 2,700 Units/hr (06/15/20 2234)    Assessment: 60 yo M to start Heparin drip for Pulm.Embolism with R heart strain.  No anticaog PTA per Med Rec Hgb 13.8  Plt 255 INR 1.1  APTT 36 -patient w/ recent Covid-diagnosed 05/06/20 and hospitalized at [facility] then rehab at [facility].  0212 0012 HL <0.10, subtherapeutic, aPTT 57   Heparin 3000 unit bolus x 1.Increase Heparin infusion rate to 2100 units/hr 2/12  0946 HL <0.10.  Subtherapeutic. Called RN to verify Heparin drip is running. Per RN: appeared Heparin drip infiltrated and Heparin drip was moved to other arm. 2/12  2136 HL 0.29 - Subtherapeutic 02/13 0455 HL 0.50, Therapeutic x 1  Goal of Therapy:  Heparin level 0.3-0.7 units/ml Monitor platelets by anticoagulation protocol: Yes   Plan:  . Will continue drip rate at 2700 units/hr. Will recheck HL in 6 hours Continue to monitor CBC *Addendum: per Dr.Amin pt is transitioning off Heparin drip to Xarelto on 2/13 am  3/13, PharmD, [facility] 06/16/2020 6:27 AM

## 2020-06-17 ENCOUNTER — Encounter: Payer: Self-pay | Admitting: Vascular Surgery

## 2020-06-17 LAB — LEGIONELLA PNEUMOPHILA SEROGP 1 UR AG: L. pneumophila Serogp 1 Ur Ag: NEGATIVE

## 2020-06-19 DIAGNOSIS — U099 Post covid-19 condition, unspecified: Secondary | ICD-10-CM | POA: Insufficient documentation

## 2020-06-19 DIAGNOSIS — R0602 Shortness of breath: Secondary | ICD-10-CM | POA: Insufficient documentation

## 2020-06-19 DIAGNOSIS — J8410 Pulmonary fibrosis, unspecified: Secondary | ICD-10-CM | POA: Insufficient documentation

## 2020-06-19 LAB — CULTURE, BLOOD (ROUTINE X 2)
Culture: NO GROWTH
Culture: NO GROWTH
Special Requests: ADEQUATE

## 2020-06-27 ENCOUNTER — Telehealth: Payer: Self-pay

## 2020-06-27 NOTE — Telephone Encounter (Signed)
Pt's wife has called and has canceled his pulmonary appointments due to travel. Jack Barton

## 2020-06-28 ENCOUNTER — Telehealth: Payer: Self-pay

## 2020-06-28 NOTE — Telephone Encounter (Signed)
Faxed records to [employer]

## 2020-07-01 ENCOUNTER — Ambulatory Visit: Payer: Managed Care, Other (non HMO)

## 2020-07-08 ENCOUNTER — Telehealth: Payer: Self-pay

## 2020-07-08 ENCOUNTER — Ambulatory Visit: Payer: Managed Care, Other (non HMO)

## 2020-07-08 NOTE — Telephone Encounter (Signed)
Received short term disability on patient, per pt family they are planning on seeing a pulmonary provider closer to their home, no paperwork will be completed by our office. Jack Barton

## 2020-07-17 ENCOUNTER — Ambulatory Visit: Payer: Self-pay | Admitting: Hospice and Palliative Medicine

## 2020-07-24 ENCOUNTER — Ambulatory Visit: Payer: Self-pay | Admitting: Internal Medicine

## 2020-07-31 ENCOUNTER — Ambulatory Visit: Payer: Self-pay | Admitting: Hospice and Palliative Medicine

## 2021-03-04 HISTORY — PX: HERNIA REPAIR: SHX51

## 2021-08-15 IMAGING — CT CT ANGIO CHEST
2 of 6 series · 15 of 46 positions shown · IV contrast (omnipaque)
Comparison: Chest radiograph June 14, 2020; CT angiogram chest
May 09, 2020 CT abdomen and pelvis August 11, 2017

CLINICAL DATA: Chest and abdominal pain

EXAM:
CT ANGIOGRAPHY CHEST
CT ABDOMEN AND PELVIS WITH CONTRAST
TECHNIQUE: Multidetector CT imaging of the chest was performed using the
standard protocol during bolus administration of intravenous
contrast. Multiplanar CT image reconstructions and MIPs were
obtained to evaluate the vascular anatomy. Multidetector CT imaging
of the abdomen and pelvis was performed using the standard protocol
during bolus administration of intravenous contrast.
CONTRAST:  100mL OMNIPAQUE IOHEXOL 350 MG/ML SOLN

[Series 5: thins · axial · 0.64mm/px · z∈[-776,-482]mm · 12 of 324 slices shown]
[im 15/324  lung]
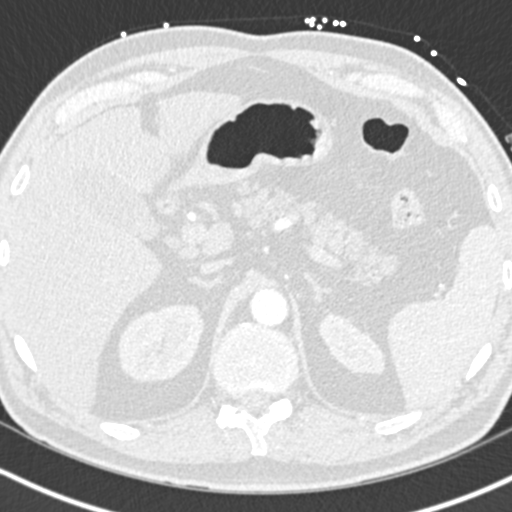
[im 43/324  soft-tissue]
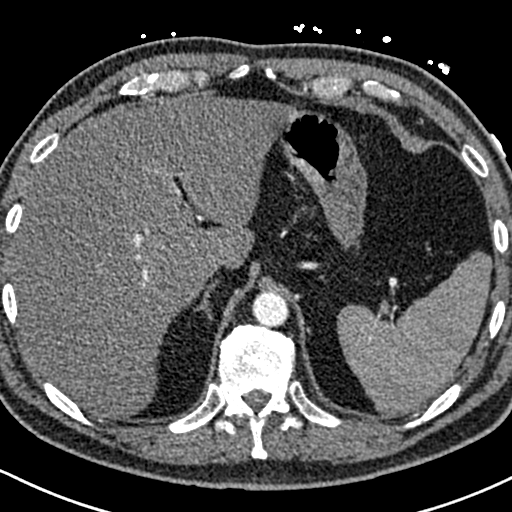
[im 71/324  lung]
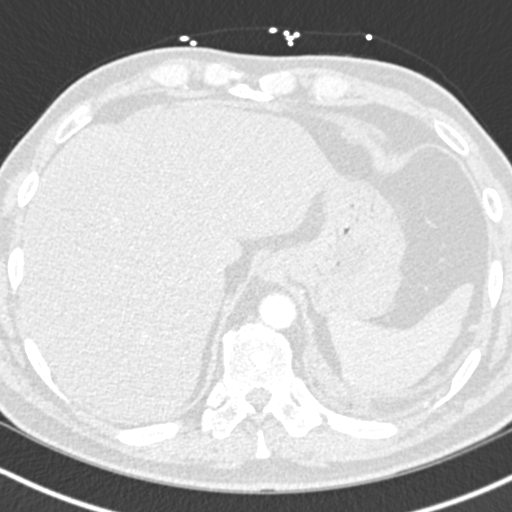
[im 99/324  soft-tissue]
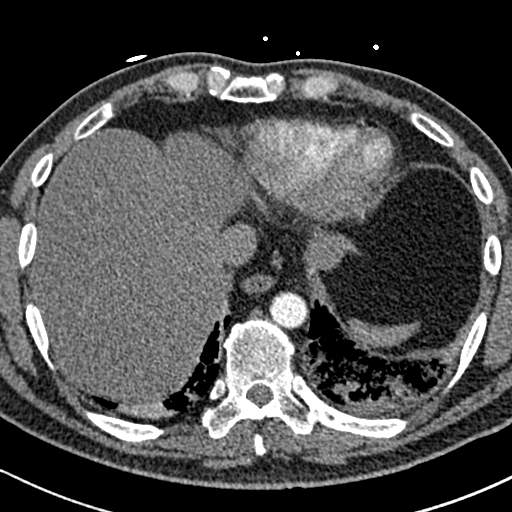
[im 127/324  lung]
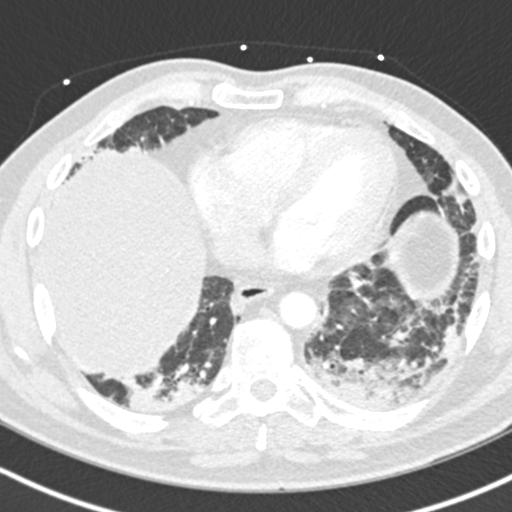
[im 155/324  soft-tissue]
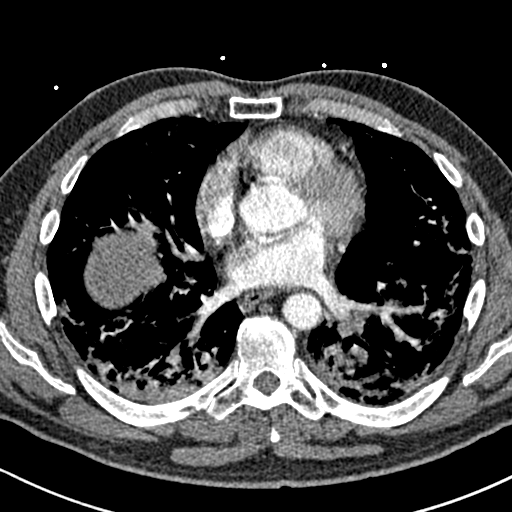
[im 169/324  lung]
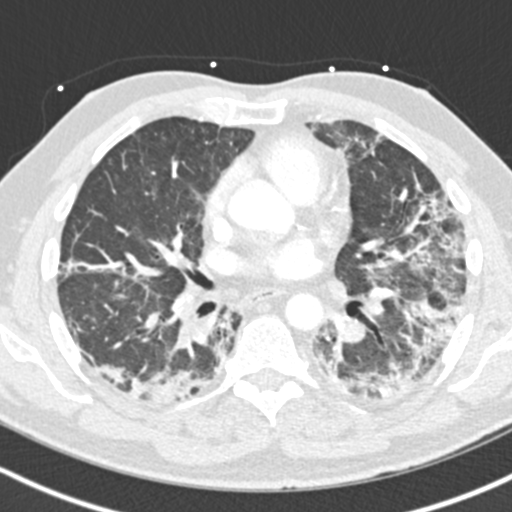
[im 197/324  soft-tissue]
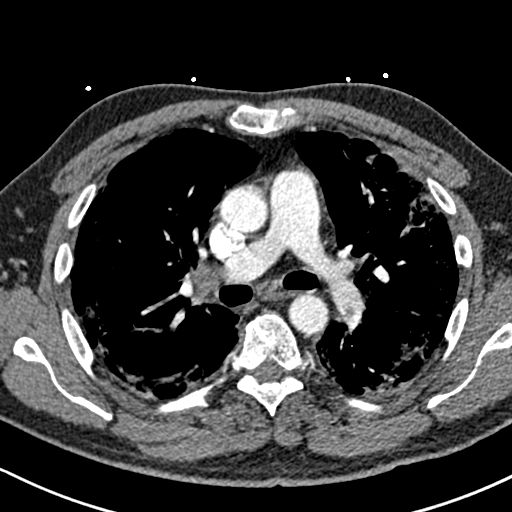
[im 225/324  lung]
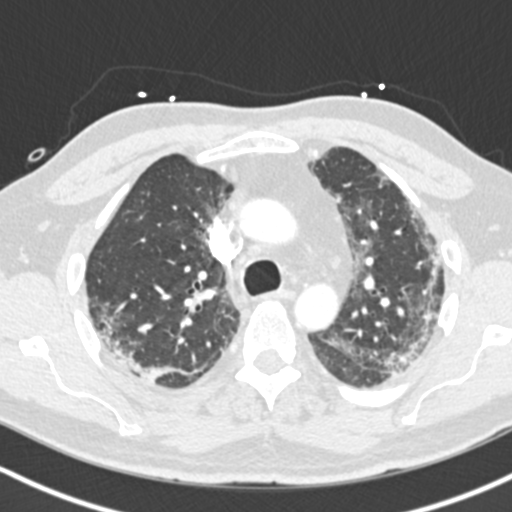
[im 253/324  soft-tissue]
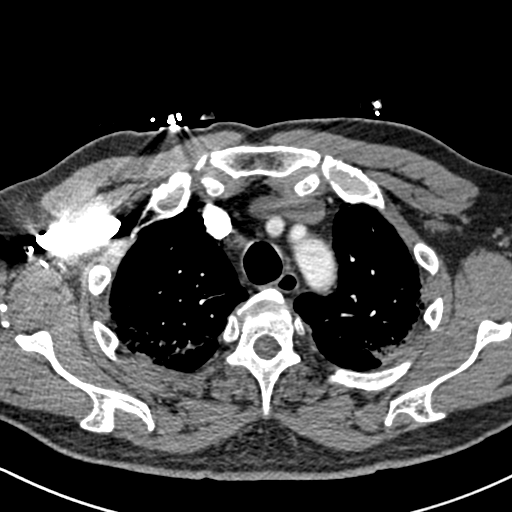
[im 281/324  lung]
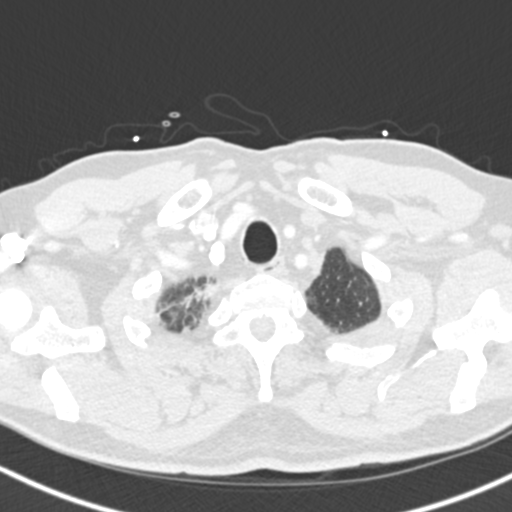
[im 309/324  soft-tissue]
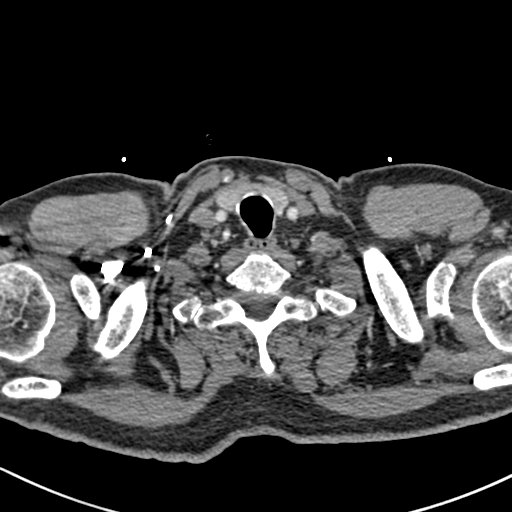

[Series 7: coronal mpr · coronal · 0.65mm/px · 3 of 144 slices shown]
[im 36/144  soft-tissue]
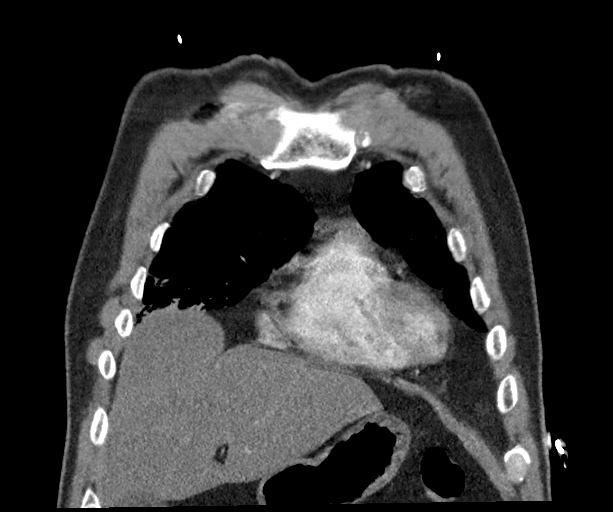
[im 72/144  soft-tissue]
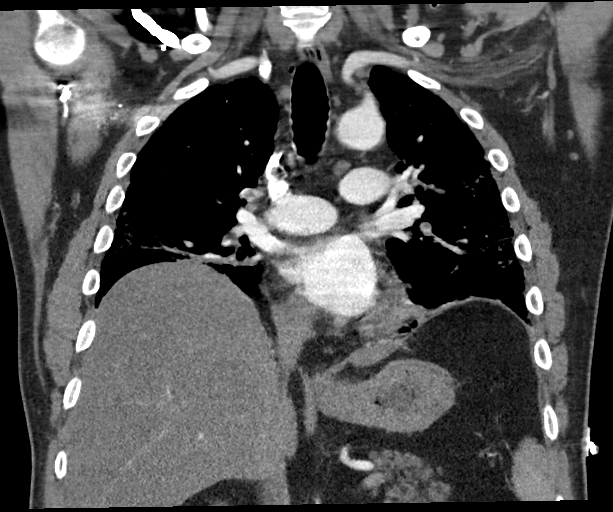
[im 108/144  soft-tissue]
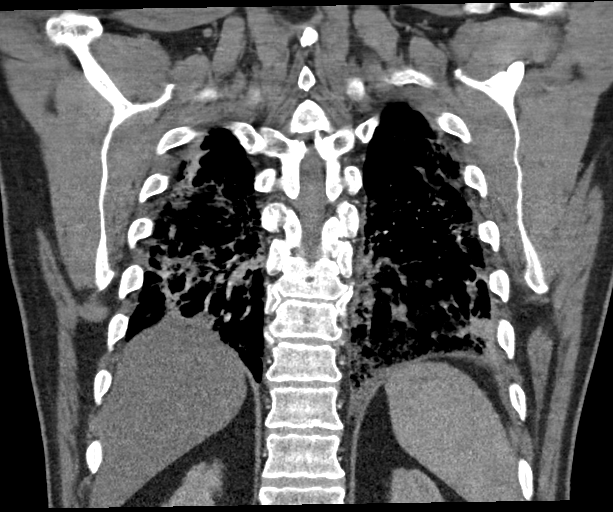

[15 of 46 positions shown; findings below may reference images not displayed]

FINDINGS: CTA CHEST FINDINGS

Cardiovascular: There are pulmonary emboli arising from each distal
main pulmonary artery with extension of pulmonary emboli into
multiple lower lobe pulmonary artery branches bilaterally. Small
focus of pulmonary embolus at the origin of the right upper lobe
pulmonary artery noted. The right ventricle to left ventricle
diameter ratio is 1.2, indicative of a degree of right heart strain.
There is no appreciable thoracic aortic aneurysm or dissection.
Visualized great vessels appear normal. There are occasional foci of
coronary artery calcification. There is no appreciable pericardial
effusion or pericardial thickening.

Mediastinum/Nodes: Visualized thyroid appears normal. There is no
evident thoracic adenopathy by size criteria. There are scattered
subcentimeter mediastinal lymph nodes which do not meet size
criteria for pathologic significance. No esophageal lesions are
evident.

Lungs/Pleura: There is airspace opacity throughout both lower lobes
with more scattered opacity in the periphery of each upper lobe and
right middle lobe. There is suggestion of a degree of underlying
fibrotic type change. There are no appreciable pleural effusions.
Note that in comparison with most recent chest CT, there is somewhat
less opacity in the upper lobes bilaterally.

Musculoskeletal: There are no blastic or lytic bone lesions. No
chest wall lesions.

Review of the MIP images confirms the above findings.

CT ABDOMEN and PELVIS FINDINGS

Hepatobiliary: There is hepatic steatosis. No focal liver lesions
are evident. The gallbladder wall is not appreciably thickened.
There is no biliary duct dilatation.

Pancreas: There is no pancreatic mass or inflammatory focus.

Spleen: No splenic lesions are evident.

Adrenals/Urinary Tract: Adrenals bilaterally appear normal. There is
a cyst in the medial mid right kidney measuring 1.3 x 1.3 cm. There
is no evident hydronephrosis on either side. There is no evident
renal or ureteral calculus on either side. Urinary bladder is
midline with wall thickness within normal limits.

Stomach/Bowel: There is no appreciable bowel wall or mesenteric
thickening. Terminal ileum appears normal. No evident bowel
obstruction. There is no evident free air or portal venous air.

Vascular/Lymphatic: There is aortic atherosclerosis. No aneurysm
evident. Major venous structures appear patent. No adenopathy
appreciable in the abdomen or pelvis.

Reproductive: There are occasional prostatic calculi. Prostate and
seminal vesicles normal in size and configuration.

Other: Appendix appears normal. No abscess or ascites in the abdomen
or pelvis. There is again noted a left inguinal hernia containing
fluid but no bowel. This appearance is stable compared to the
previous study.

Musculoskeletal: No blastic or lytic bone lesions. No intramuscular
lesions are evident.

Review of the MIP images confirms the above findings.
IMPRESSION: CT angiogram chest:

1. Pulmonary emboli arise from each distal main pulmonary artery
with extension into multiple lower lobe pulmonary artery branches
bilaterally. Pulmonary embolus also seen in the proximal aspect of
the right upper lobe pulmonary artery. Positive for acute PE with
CTevidence of right heart strain (RV/LV Ratio = 1.2) consistent with
at least submassive (intermediate risk) PE. The presence of right
heart strain has been associated with an increased risk of morbidity
and mortality.

2. Multifocal pneumonia with a lower lobe predominance. There
appears to be underlying fibrotic change in the periphery of the
lower lobes. Somewhat less opacity in the upper lobes compared to
most recent CT. Appearance consistent with COVID 19 pneumonitis with
potential degree of bacterial superinfection.

3.  Foci of coronary artery calcification noted.

4.  No appreciable adenopathy.

CT abdomen and pelvis:

1. No bowel wall thickening or bowel obstruction. No abscess in the
abdomen or pelvis. Appendix appears normal.
2. No appreciable renal or ureteral calculus. No hydronephrosis.
Urinary bladder wall thickness normal.
3. Stable small inguinal hernia on the left containing fluid but no
bowel.
4. Hepatic steatosis.
5.  Aortic Atherosclerosis ([id number]).

Critical Value/emergent results were called by telephone at the time
of interpretation on 06/14/2020 at [DATE] to provider GENNADY CHARLIE ,
who verbally acknowledged these results.

## 2021-08-15 IMAGING — DX DG CHEST 1V PORT
1 series · 1 of 1 positions shown · non-contrast
Comparison: 06/14/2020

CLINICAL DATA: Shortness of breath.

EXAM:
PORTABLE CHEST 1 VIEW

[chest ap]
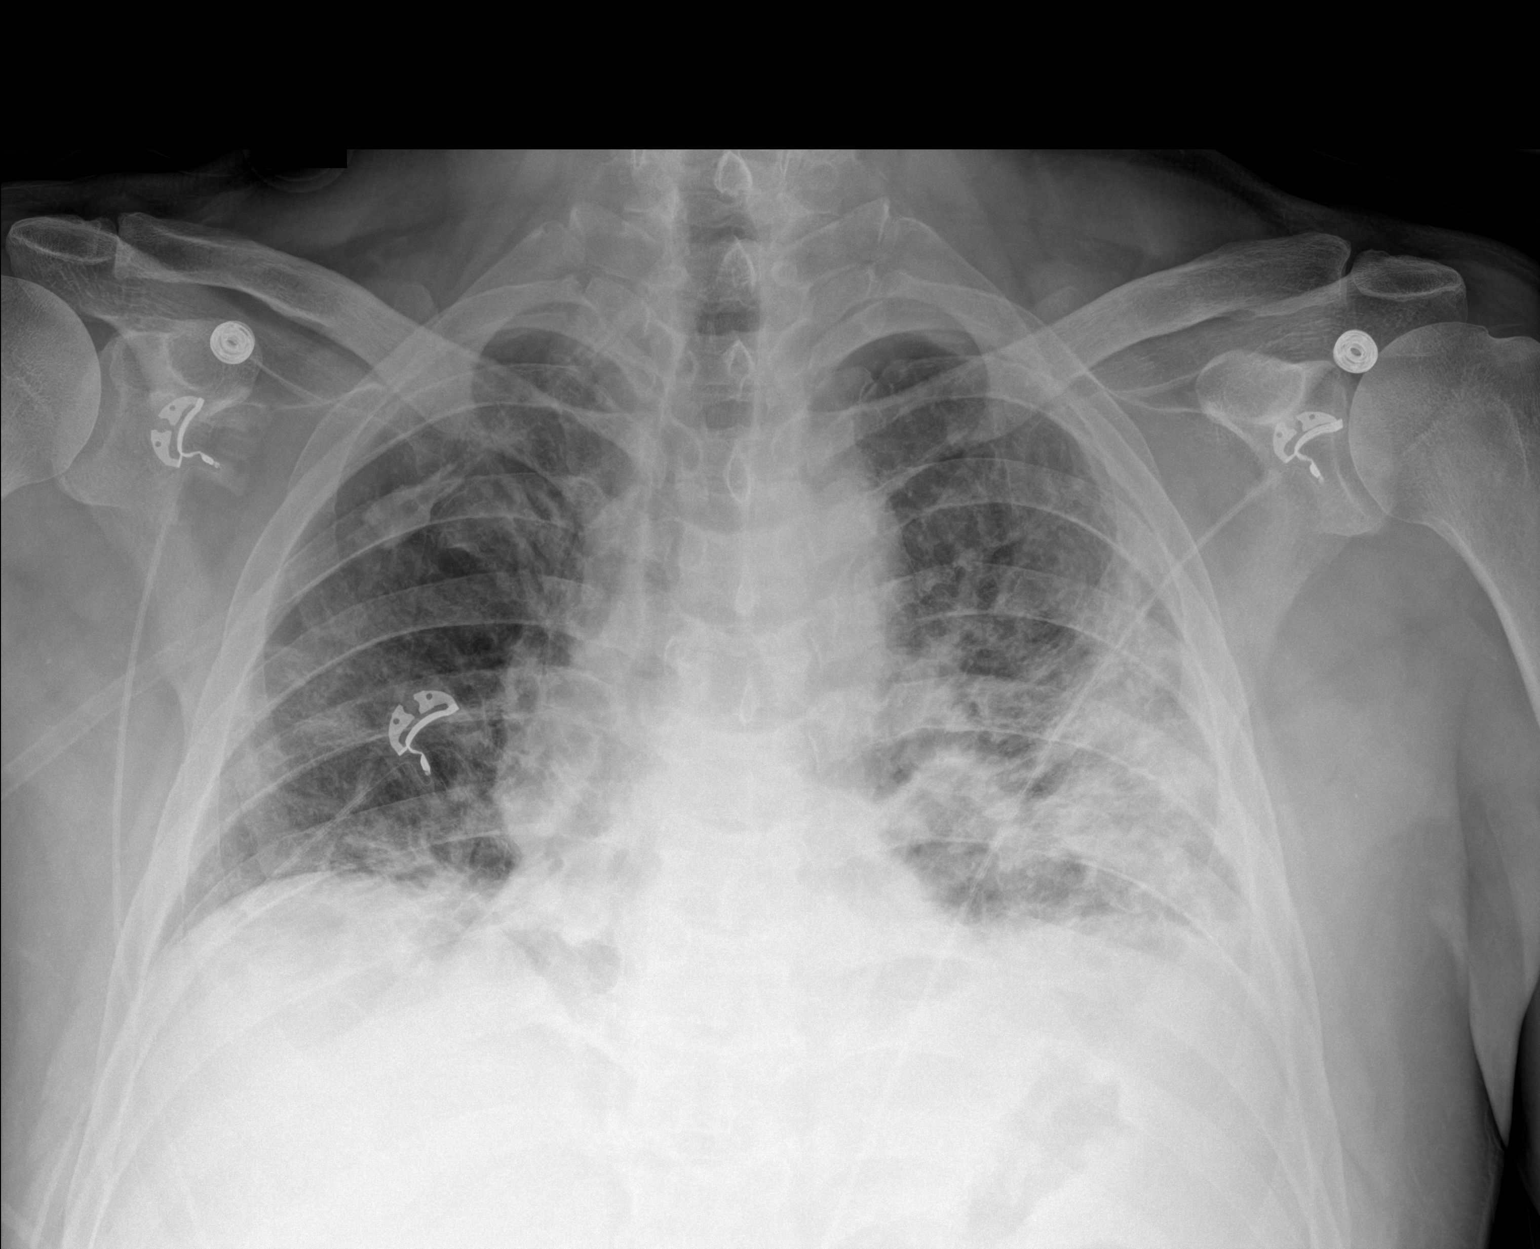

[1 of 1 positions shown; findings below may reference images not displayed]

FINDINGS: Again noted are diffuse bilateral airspace opacities with interval
worsening since the prior study earlier today. There is no
pneumothorax. There are probable small bilateral pleural effusions,
left greater than right. The heart size is stable. There is no acute
osseous abnormality.
IMPRESSION: Worsening multifocal airspace opacities bilaterally consistent with
multifocal pneumonia.

## 2021-08-15 IMAGING — DX DG CHEST 1V PORT
1 series · 1 of 1 positions shown · non-contrast
Comparison: May 06, 2020

CLINICAL DATA: Tachycardia with decreased oxygen saturation

EXAM:
PORTABLE CHEST 1 VIEW

[chest ap]
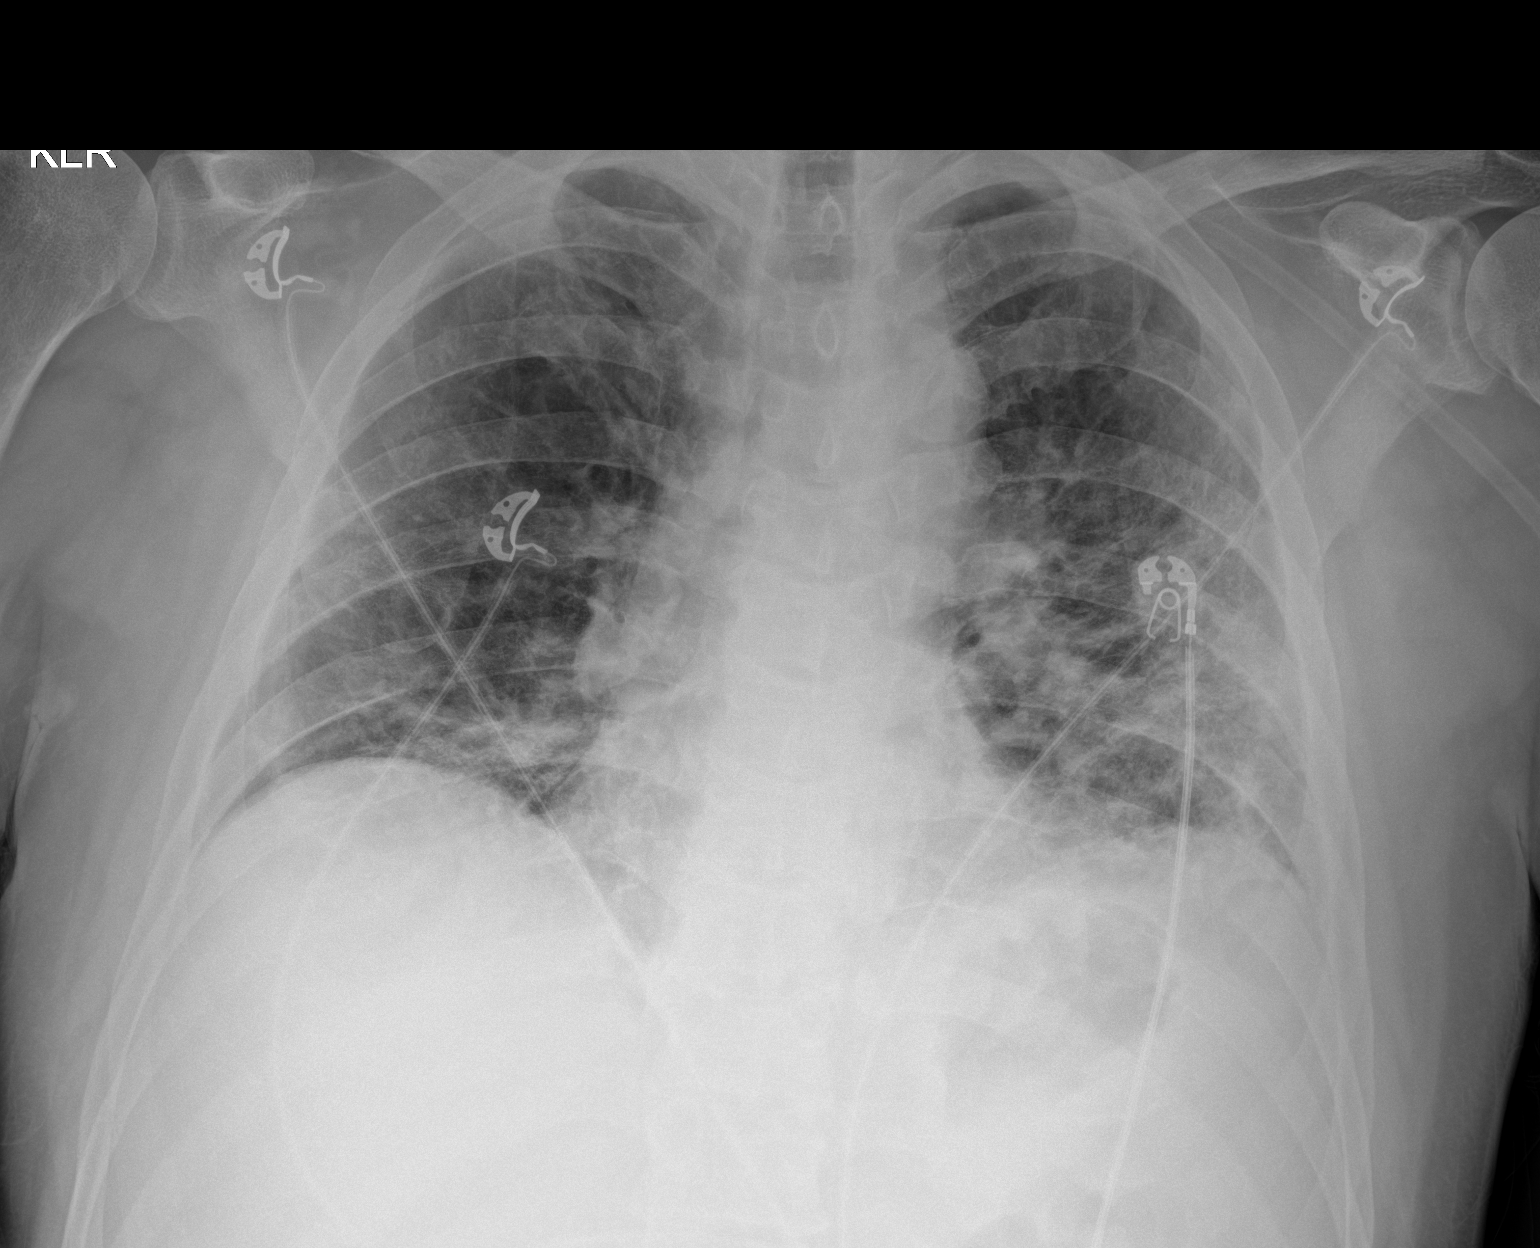

[1 of 1 positions shown; findings below may reference images not displayed]

FINDINGS: There is patchy airspace opacity in the left mid lung region as well
as in both lower lung regions. Heart size and pulmonary vascularity
are normal. No adenopathy. No bone lesions.
IMPRESSION: Patchy airspace opacity at several sites, more severe than on prior
study. Appearance raises concern for multifocal atypical organism
pneumonia. Check of [id number] status in this regard advised. No frank
consolidation.

Heart size normal.

## 2021-10-13 ENCOUNTER — Telehealth: Payer: Self-pay | Admitting: Psychiatry

## 2021-10-13 ENCOUNTER — Encounter: Payer: Self-pay | Admitting: *Deleted

## 2021-10-13 ENCOUNTER — Ambulatory Visit: Payer: Managed Care, Other (non HMO) | Admitting: Psychiatry

## 2021-10-13 VITALS — BP 116/77 | HR 63 | Ht 76.0 in | Wt 226.0 lb

## 2021-10-13 DIAGNOSIS — R413 Other amnesia: Secondary | ICD-10-CM

## 2021-10-13 DIAGNOSIS — U099 Post covid-19 condition, unspecified: Secondary | ICD-10-CM

## 2021-10-13 DIAGNOSIS — G8929 Other chronic pain: Secondary | ICD-10-CM

## 2021-10-13 DIAGNOSIS — R519 Headache, unspecified: Secondary | ICD-10-CM | POA: Diagnosis not present

## 2021-10-13 DIAGNOSIS — R200 Anesthesia of skin: Secondary | ICD-10-CM

## 2021-10-13 DIAGNOSIS — R202 Paresthesia of skin: Secondary | ICD-10-CM

## 2021-10-13 MED ORDER — RIZATRIPTAN BENZOATE 10 MG PO TABS: 10.0000 mg | ORAL_TABLET | ORAL | 6 refills | Status: AC | PRN

## 2021-10-13 MED ORDER — DULOXETINE HCL 30 MG PO CPEP: 30.0000 mg | ORAL_CAPSULE | Freq: Every day | ORAL | 6 refills | Status: AC

## 2021-10-13 NOTE — Telephone Encounter (Signed)
[employer] sent to GI they obtain auth for both and will call the patient to schedule

## 2021-10-13 NOTE — Progress Notes (Signed)
[facility]  PATIENT: Jack Barton DOB: 11/29/1960  REFERRING CLINICIAN: Noni Saupeedding, John F. II, MD HISTORY FROM: self. Spouse Lisa REASON FOR VISIT: memory loss, headaches   HISTORICAL  CHIEF COMPLAINT:  Chief Complaint  Patient presents with   Follow-up    RM 8 with spouse Misty StanleyLisa  Pt is well, has been having memory issues since Jan 2022 post covid. Has problems retraining new information .     HISTORY OF PRESENT ILLNESS:  The patient presents for evaluation of memory loss which has been present since COVID infection in January 2022. His COVID infection was severe. States his oxygen saturation dropped to the 50s and he was in the ICU for 8 days. 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 Took Eliquis for one year following his hospitalization.  Since then he has had persistent memory issues. Remembers everything that happened before COVID, but struggles with processing new information. Will forget what his wife said a few seconds earlier. Has forgotten what a preacher was talking about in church by the end of the service. Has word finding difficulty and will lose his train of thought easily. Memory issues have been stable and has not been getting worse or better.  Has constant right sided headaches which are worse when he exerts himself cognitively or physically. Headache will fluctuate in severity and can be severe enough that he will need to lie down. They are associated with photophobia and phonophobia, no nausea. Feels like the right side of his face is numb as well. No vision changes. Severe headaches can last all day.   Has intermittent tremors when he is writing. This has improved slightly over time. He does not have a tremor today.  TBI:  No past history of TBI Stroke:  no past history of stroke Seizures:  no past history of seizures Sleep: works third shift, averages about 6 hours of sleep per day Mood: patient gets irritable with headaches and with difficulty  remembering things  Functional status: independent in all ADLs and IADLs Patient lives with his wife Cooking: no issues Cleaning: no issues Shopping: no issues Driving: no issues Bills: no issues Medications: no issues Word finding difficulty? yes  OTHER MEDICAL CONDITIONS: hx DVT/PE   REVIEW OF SYSTEMS: Full 14 system review of systems performed and negative with exception of: headaches, memory loss, imbalance  ALLERGIES: No Known Allergies  HOME MEDICATIONS: Outpatient Medications Prior to Visit  Medication Sig Dispense Refill   aspirin EC 81 MG tablet Take 81 mg by mouth daily. Swallow whole.     Multiple Vitamin (MULTIVITAMIN) tablet Take 1 tablet by mouth daily.     cefdinir (OMNICEF) 300 MG capsule Take 300 mg by mouth 2 (two) times daily. (Patient not taking: Reported on 10/13/2021)     dextromethorphan-guaiFENesin (MUCINEX DM) 30-600 MG 12hr tablet Take 1 tablet by mouth 2 (two) times daily as needed for cough. (Patient not taking: Reported on 10/13/2021) 30 tablet 0   ipratropium-albuterol (DUONEB) 0.5-2.5 (3) MG/3ML SOLN Take 3 mLs by nebulization every 4 (four) hours as needed. (Patient not taking: Reported on 10/13/2021) 360 mL 1   omeprazole (PRILOSEC) 20 MG capsule Take 20 mg by mouth daily. (Patient not taking: Reported on 10/13/2021)     pantoprazole (PROTONIX) 40 MG tablet Take 40 mg by mouth daily. (Patient not taking: Reported on 10/13/2021)     RIVAROXABAN (XARELTO) VTE STARTER PACK (15 & 20 MG) Follow package directions: Take one 15mg  tablet by mouth twice a day.  On day 22, switch to one 20mg  tablet once a day. Take with food. (Patient not taking: Reported on 10/13/2021) 51 each 0   No facility-administered medications prior to visit.    PAST MEDICAL HISTORY: Past Medical History:  Diagnosis Date   Chronic fatigue    COVID-19 virus infection    GERD (gastroesophageal reflux disease)    Headache    Memory impairment of gradual onset    Pulmonary embolism (HCC)     hx of    PAST SURGICAL HISTORY: Past Surgical History:  Procedure Laterality Date   HERNIA REPAIR  03/2021   PULMONARY THROMBECTOMY Bilateral 06/14/2020   Procedure: PULMONARY THROMBECTOMY / THROMBOLYSIS;  Surgeon: 08/12/2020, MD;  Location: [facility] INVASIVE CV [facility];  Service: Cardiovascular;  Laterality: Bilateral;    FAMILY HISTORY: Family History  Problem Relation Age of Onset   Ovarian cancer Mother     SOCIAL HISTORY: Social History   Socioeconomic History   Marital status: Married    Spouse name: Not on file   Number of children: 1   Years of education: Not on file   Highest education level: Not on file  Occupational History   Not on file  Tobacco Use   Smoking status: Former    Years: 15.00    Types: Cigarettes   Smokeless tobacco: Never   Tobacco comments:    Quit age 23  Substance and Sexual Activity   Alcohol use: Never   Drug use: Never   Sexual activity: Not on file  Other Topics Concern   Not on file  Social History Narrative   Not on file   Social Determinants of Health   Financial Resource Strain: Not on file  Food Insecurity: Not on file  Transportation Needs: Not on file  Physical Activity: Not on file  Stress: Not on file  Social Connections: Not on file  Intimate Partner Violence: Not on file     PHYSICAL EXAM   GENERAL EXAM/CONSTITUTIONAL: Vitals:  Vitals:   10/13/21 1119  BP: 116/77  Pulse: 63  Weight: 226 lb (102.5 kg)  Height: 6\' 4"  (1.93 m)   Body mass index is 27.51 kg/m. Wt Readings from Last 3 Encounters:  10/13/21 226 lb (102.5 kg)  06/16/20 226 lb 14.6 oz (102.9 kg)  06/11/20 229 lb 9.6 oz (104.1 kg)   NEUROLOGIC: MENTAL STATUS:     10/13/2021   11:27 AM  Montreal Cognitive Assessment   Visuospatial/ Executive (0/5) 4  Naming (0/3) 3  Attention: Read list of digits (0/2) 2  Attention: Read list of letters (0/1) 1  Attention: Serial 7 subtraction starting at 100 (0/3) 3  Language: Repeat phrase  (0/2) 2  Language : Fluency (0/1) 1  Abstraction (0/2) 2  Delayed Recall (0/5) 2  Orientation (0/6) 6  Total 26    CRANIAL NERVE:  2nd, 3rd, 4th, 6th - pupils equal and reactive to light, visual fields full to confrontation, extraocular muscles intact, no nystagmus 5th - diminished sensation right V1-3 7th - facial strength symmetric 8th - hearing intact 9th - palate elevates symmetrically, uvula midline 11th - shoulder shrug symmetric 12th - tongue protrusion midline  MOTOR:  normal bulk and tone, no cogwheeling, full strength in the BUE, BLE  SENSORY:  normal and symmetric to light touch all 4 extremities  COORDINATION:  finger-nose-finger intact bilaterally  REFLEXES:  Brisk lower extremity reflexes with crossed adductors  GAIT/STATION:  normal     DIAGNOSTIC DATA (LABS, IMAGING, TESTING) -  I reviewed patient records, labs, notes, testing and imaging myself where available.  Lab Results  Component Value Date   WBC 9.4 06/16/2020   HGB 10.5 (L) 06/16/2020   HCT 31.4 (L) 06/16/2020   MCV 90.5 06/16/2020   PLT 251 06/16/2020      Component Value Date/Time   NA 132 (L) 06/16/2020 0455   K 3.7 06/16/2020 0455   CL 98 06/16/2020 0455   CO2 24 06/16/2020 0455   GLUCOSE 117 (H) 06/16/2020 0455   BUN 16 06/16/2020 0455   CREATININE 0.71 06/16/2020 0455   CALCIUM 8.5 (L) 06/16/2020 0455   PROT 7.5 06/14/2020 0913   ALBUMIN 3.4 (L) 06/14/2020 0913   AST 20 06/14/2020 0913   ALT 17 06/14/2020 0913   ALKPHOS 91 06/14/2020 0913   BILITOT 1.2 06/14/2020 0913   GFRNONAA >60 06/16/2020 0455   No results found for: "CHOL", "HDL", "LDLCALC", "LDLDIRECT", "TRIG", "CHOLHDL" No results found for: "HGBA1C" Lab Results  Component Value Date   VITAMINB12 391 06/16/2020   No results found for: "TSH"  CTH 01/15/21: 1.  No acute cranial process.  2.  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    ASSESSMENT AND PLAN  61 y.o. year old male with a history of  DVT/PE who presents for evaluation of short term memory loss and new daily persistent headache following COVID infection. Exam is significant for decreased sensation over his right face. Will order MRI brain to assess for neurodegenerative changes as well as structural causes of side-locked headache and numbness. CTV ordered to assess for CVST. His headaches are associated with migrainous features. Will start Cymbalta for headache prevention and Maxalt for rescue. Referral to speech therapy placed for cognitive rehab to help with his word finding difficulty.   1. Nonintractable headache, unspecified chronicity pattern, unspecified headache type   2. Memory loss       PLAN: - MRI brain, CTV head - Start Cymbalta 30 mg daily for headache prevention - Start Maxalt 10 mg PRN for migraine rescue - Referral to Speech Therapy for cognitive rehab  Orders Placed This Encounter  Procedures   MR BRAIN W WO CONTRAST   CT VENOGRAM HEAD   Ambulatory referral to Speech Therapy    Meds ordered this encounter  Medications   DULoxetine (CYMBALTA) 30 MG capsule    Sig: Take 1 capsule (30 mg total) by mouth daily.    Dispense:  30 capsule    Refill:  6   rizatriptan (MAXALT) 10 MG tablet    Sig: Take 1 tablet (10 mg total) by mouth as needed for migraine. May repeat in 2 hours if needed    Dispense:  10 tablet    Refill:  6    Return in about 5 months (around 03/15/2022).  I spent an average of 60 minutes chart reviewing and counseling the patient, with at least 50% of the time face to face with the patient. Headache education was done. Preventive and acute medications were discussed.  Ocie Doyne, MD 10/13/21 12:39 PM   [facility] [address] [place], [place] [address] [phone]

## 2021-10-13 NOTE — Patient Instructions (Addendum)
MRI of the brain and CT of the veins in the brain Referral to speech therapy for cognitive rehab Start Cymbalta 30 mg daily for headache prevention Start rizatriptan (Maxalt) as needed for migraine. Take at the onset of migraine. If headache recurs or does not fully resolve, you may take a second dose after 2 hours. Please avoid taking more than 2 days per week to avoid rebound headaches

## 2021-10-14 ENCOUNTER — Ambulatory Visit
Admission: RE | Admit: 2021-10-14 | Discharge: 2021-10-14 | Disposition: A | Discharge status: Home / Self Care | Payer: Managed Care, Other (non HMO) | Source: Ambulatory Visit | Attending: Psychiatry | Admitting: Psychiatry

## 2021-10-14 ENCOUNTER — Telehealth: Payer: Self-pay | Admitting: Psychiatry

## 2021-10-14 DIAGNOSIS — R200 Anesthesia of skin: Secondary | ICD-10-CM | POA: Diagnosis not present

## 2021-10-14 DIAGNOSIS — R413 Other amnesia: Secondary | ICD-10-CM

## 2021-10-14 DIAGNOSIS — R519 Headache, unspecified: Secondary | ICD-10-CM | POA: Diagnosis not present

## 2021-10-14 DIAGNOSIS — R202 Paresthesia of skin: Secondary | ICD-10-CM | POA: Diagnosis not present

## 2021-10-14 MED ORDER — GADOBENATE DIMEGLUMINE 529 MG/ML IV SOLN
20.0000 mL | Freq: Once | INTRAVENOUS | Status: AC | PRN
Start: 2021-10-14 — End: 2021-10-14
  Administered 2021-10-14: 20 mL via INTRAVENOUS

## 2021-10-14 NOTE — Telephone Encounter (Signed)
Referral for Physical Therapy sent to [facility] [phone].

## 2021-10-20 ENCOUNTER — Telehealth: Payer: Self-pay

## 2021-10-20 NOTE — Telephone Encounter (Signed)
-----   Message from Ocie Doyne, MD sent at 10/20/2021 10:21 AM EDT ----- Brain MRI is normal

## 2021-10-20 NOTE — Telephone Encounter (Signed)
PHONE RM CAN RELAY  Contacted pt, LVM rq call back   MRI Brain was normal, no concerns

## 2021-10-20 NOTE — Telephone Encounter (Signed)
Pt returned phone call, relay results as instructed.

## 2021-10-31 NOTE — Telephone Encounter (Signed)
Patient has been contacted three times to make an appointment, and has not called back to schedule with [facility].

## 2022-04-13 ENCOUNTER — Ambulatory Visit: Payer: Managed Care, Other (non HMO) | Admitting: Psychiatry

## 2023-10-29 DIAGNOSIS — Z1331 Encounter for screening for depression: Secondary | ICD-10-CM | POA: Diagnosis not present

## 2023-10-29 DIAGNOSIS — F411 Generalized anxiety disorder: Secondary | ICD-10-CM | POA: Diagnosis not present

## 2023-10-29 DIAGNOSIS — Z1339 Encounter for screening examination for other mental health and behavioral disorders: Secondary | ICD-10-CM | POA: Diagnosis not present

## 2023-10-29 DIAGNOSIS — R519 Headache, unspecified: Secondary | ICD-10-CM | POA: Diagnosis not present

## 2023-10-29 DIAGNOSIS — N529 Male erectile dysfunction, unspecified: Secondary | ICD-10-CM | POA: Diagnosis not present

## 2023-10-29 DIAGNOSIS — Z6829 Body mass index (BMI) 29.0-29.9, adult: Secondary | ICD-10-CM | POA: Diagnosis not present

## 2023-11-29 DIAGNOSIS — R519 Headache, unspecified: Secondary | ICD-10-CM | POA: Diagnosis not present

## 2023-11-29 DIAGNOSIS — Z6829 Body mass index (BMI) 29.0-29.9, adult: Secondary | ICD-10-CM | POA: Diagnosis not present

## 2023-11-29 DIAGNOSIS — F411 Generalized anxiety disorder: Secondary | ICD-10-CM | POA: Diagnosis not present
